# Patient Record
Sex: Female | Born: 1955 | Race: Black or African American | Hispanic: No | Smoking: Former smoker
Health system: Southern US, Community
[De-identification: ages and names within clinical notes are randomized; demographics above are authoritative.]

## PROBLEM LIST (undated history)

## (undated) DIAGNOSIS — I10 Essential (primary) hypertension: Secondary | ICD-10-CM

## (undated) DIAGNOSIS — E785 Hyperlipidemia, unspecified: Secondary | ICD-10-CM

## (undated) DIAGNOSIS — D219 Benign neoplasm of connective and other soft tissue, unspecified: Secondary | ICD-10-CM

## (undated) HISTORY — DX: Benign neoplasm of connective and other soft tissue, unspecified: D21.9

## (undated) HISTORY — PX: NO PAST SURGERIES: SHX2092

## (undated) HISTORY — PX: DILATION AND CURETTAGE OF UTERUS: SHX78

---

## 2011-09-19 ENCOUNTER — Emergency Department: Payer: Self-pay | Admitting: Emergency Medicine

## 2012-12-25 IMAGING — CR LEFT WRIST - COMPLETE 3+ VIEW
1 series · 4 of 4 positions shown · non-contrast
Comparison: none

REASON FOR EXAM: pain
COMMENTS:   LMP: N/A

PROCEDURE:     DXR - DXR WRIST LT COMP WITH OBLIQUES  - September 19, 2011  [DATE]
RESULT:     Left wrist images demonstrate bony density adjacent to the
fusiform which may represent an accessory ossicle or old avulsion. No
definite acute fractures seen. Correlate clinically.

[Series 1: x wrist pa left · 0.14mm/px · 4 of 4 slices shown]
[im 1/4]
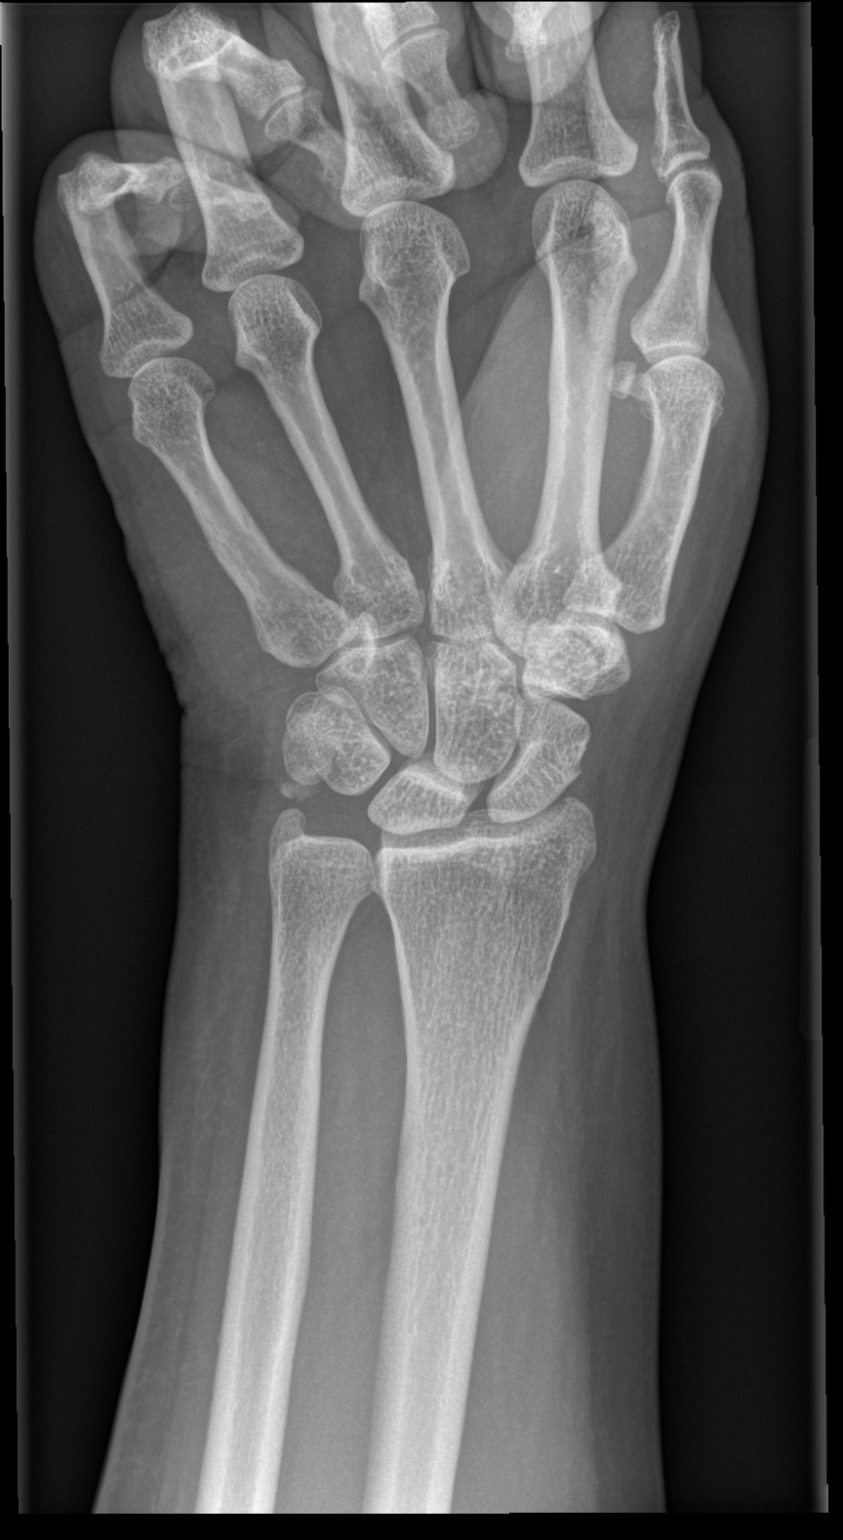
[im 2/4]
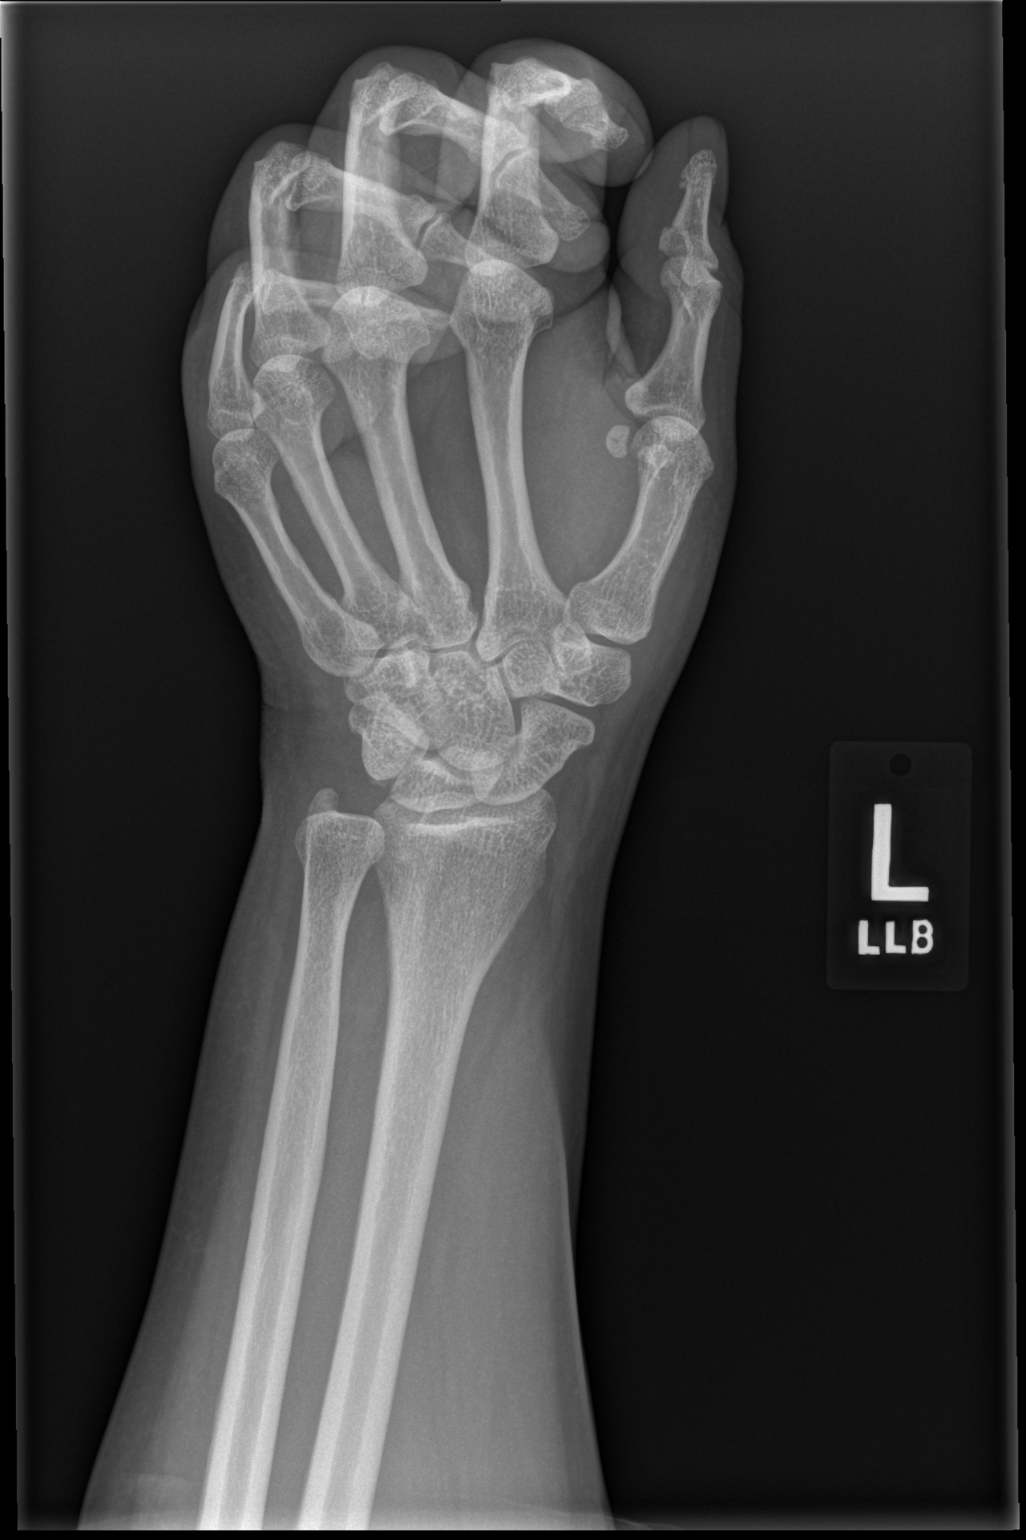
[im 3/4]
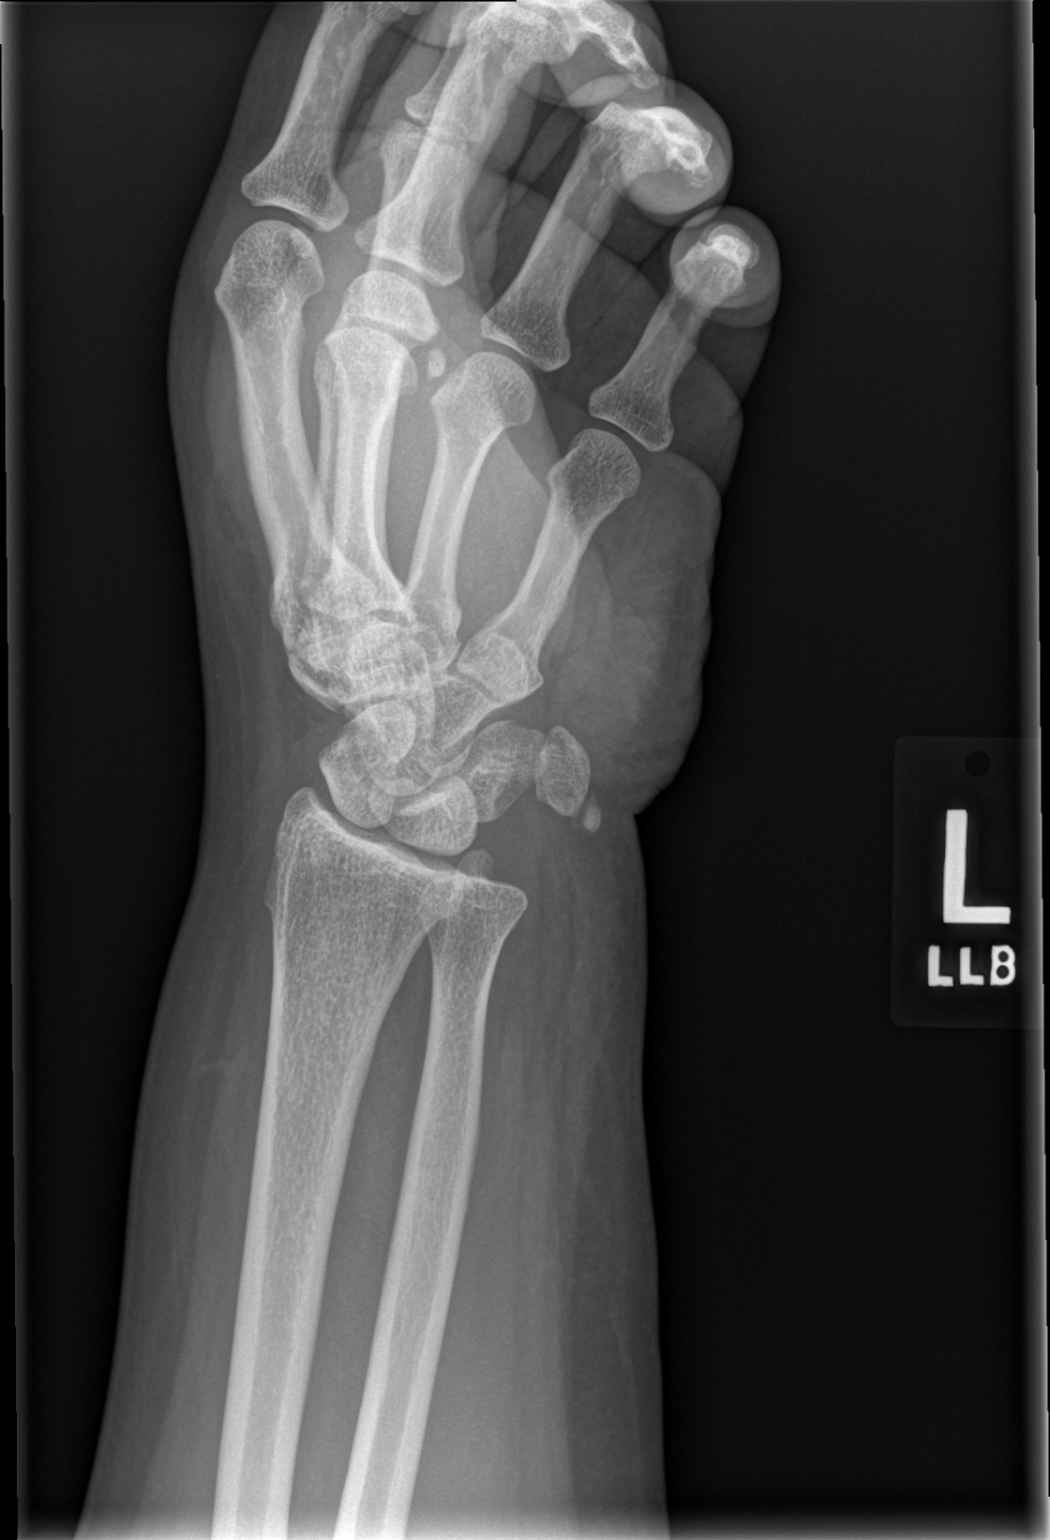
[im 4/4]
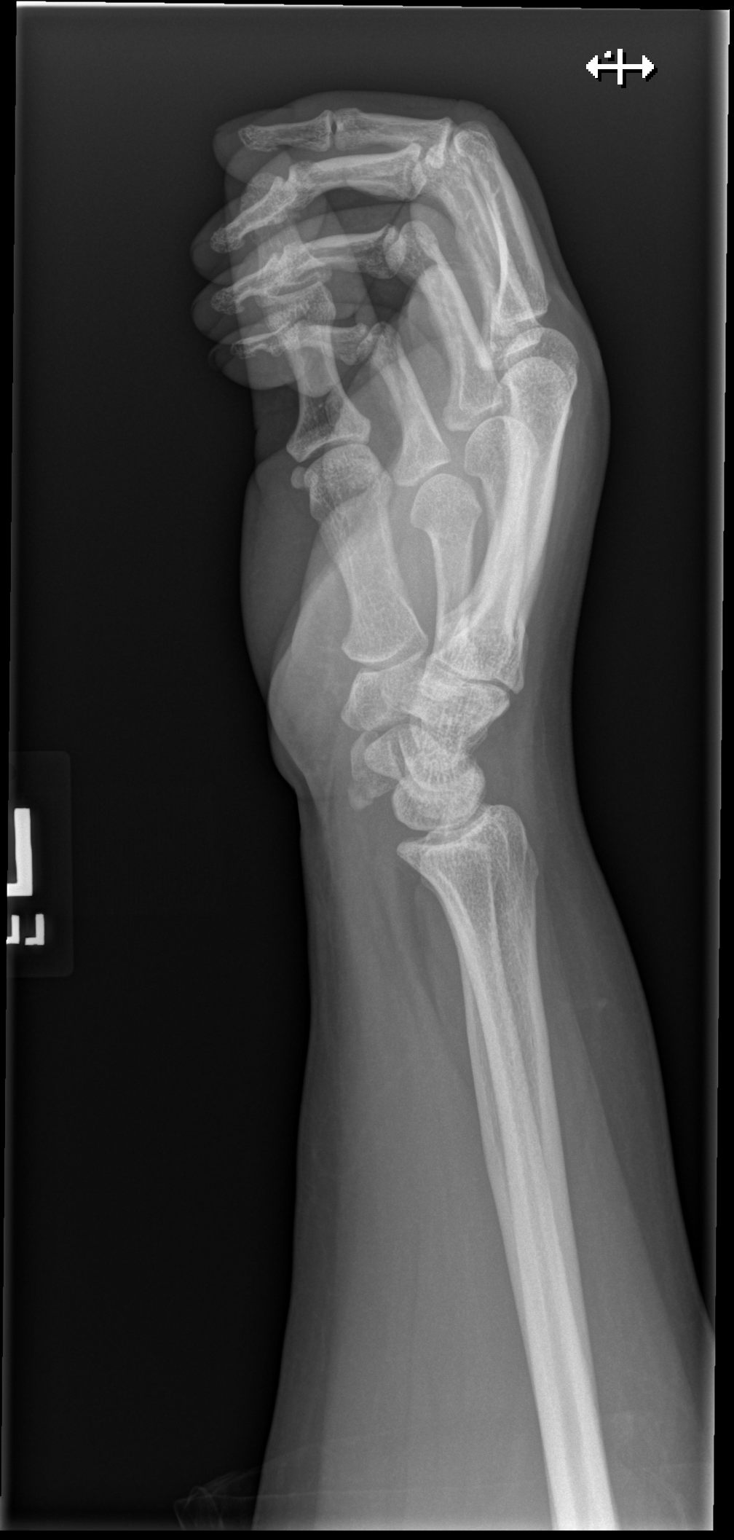

[4 of 4 positions shown; findings below may reference images not displayed]

IMPRESSION: Please see above. If occult fracture is a concern MRI
followup would be suggested.

## 2014-04-30 DIAGNOSIS — Z01419 Encounter for gynecological examination (general) (routine) without abnormal findings: Secondary | ICD-10-CM | POA: Insufficient documentation

## 2016-06-28 DIAGNOSIS — E875 Hyperkalemia: Secondary | ICD-10-CM | POA: Insufficient documentation

## 2016-07-12 DIAGNOSIS — R87619 Unspecified abnormal cytological findings in specimens from cervix uteri: Secondary | ICD-10-CM | POA: Insufficient documentation

## 2016-08-02 DIAGNOSIS — R059 Cough, unspecified: Secondary | ICD-10-CM | POA: Insufficient documentation

## 2018-07-16 DIAGNOSIS — H1030 Unspecified acute conjunctivitis, unspecified eye: Secondary | ICD-10-CM | POA: Insufficient documentation

## 2019-04-09 DIAGNOSIS — Z23 Encounter for immunization: Secondary | ICD-10-CM | POA: Insufficient documentation

## 2020-02-08 ENCOUNTER — Emergency Department: Payer: PRIVATE HEALTH INSURANCE

## 2020-02-08 ENCOUNTER — Other Ambulatory Visit: Payer: Self-pay

## 2020-02-08 ENCOUNTER — Encounter: Payer: Self-pay | Admitting: Intensive Care

## 2020-02-08 ENCOUNTER — Emergency Department
Admission: EM | Admit: 2020-02-08 | Discharge: 2020-02-08 | Disposition: A | Discharge status: Home / Self Care | Payer: PRIVATE HEALTH INSURANCE | Attending: Emergency Medicine | Admitting: Emergency Medicine

## 2020-02-08 DIAGNOSIS — N939 Abnormal uterine and vaginal bleeding, unspecified: Secondary | ICD-10-CM | POA: Diagnosis not present

## 2020-02-08 DIAGNOSIS — D259 Leiomyoma of uterus, unspecified: Secondary | ICD-10-CM | POA: Diagnosis not present

## 2020-02-08 DIAGNOSIS — F17210 Nicotine dependence, cigarettes, uncomplicated: Secondary | ICD-10-CM | POA: Diagnosis not present

## 2020-02-08 DIAGNOSIS — I10 Essential (primary) hypertension: Secondary | ICD-10-CM | POA: Diagnosis not present

## 2020-02-08 HISTORY — DX: Hyperlipidemia, unspecified: E78.5

## 2020-02-08 HISTORY — DX: Essential (primary) hypertension: I10

## 2020-02-08 NOTE — ED Triage Notes (Cosign Needed)
Emergency Medicine Provider Triage Evaluation Note  Tyshell Ramberg , a 64 y.o. female  was evaluated in triage.  Pt complains of vaginal bleeding.  Review of Systems  Positive: Vaginal bleeding, pelvic cramping, dysuria Negative: Fever  Physical Exam  There were no vitals taken for this visit. Gen:   Awake, no distress   HEENT:  Atraumatic  Resp:  Normal effort  Cardiac:  Normal rate  Abd:   Nondistended, lower pelvic tenderness MSK:   Moves extremities without difficulty  Neuro:  Speech clear   Medical Decision Making  Medically screening exam initiated at 5:20 PM.  Appropriate orders placed.  Dorthula Bier was informed that the remainder of the evaluation will be completed by another provider, this initial triage assessment does not replace that evaluation, and the importance of remaining in the ED until their evaluation is complete.  Clinical Impression   Abnormal vaginal bleeding in postmenopausal female.   Victorino Dike, FNP 02/08/20 1726

## 2020-02-08 NOTE — ED Triage Notes (Signed)
Patient c/o menstrual bleeding that started yesterday. Has had some clots. Pressure in lower abdomen. Reports more vaginal bleeding with movement. Reports d&c 2019

## 2020-02-08 NOTE — Discharge Instructions (Signed)
Please call and schedule a follow up appointment with gynecology.  Return to the ER for symptoms that change or worsen or for new concerns.

## 2020-02-25 NOTE — ED Provider Notes (Signed)
[facility] Emergency Department Provider Note  ____________________________________________  Time seen: Approximately 6:46 PM  I have reviewed the triage vital signs and the nursing notes.   HISTORY  Chief Complaint Vaginal Bleeding    HPI Felicia Barr is a 64 y.o. female who presents to the emergency department for evaluation of vaginal bleeding. She is post menopausal.  She has a history of D&C related to fibroids.  Bleeding started yesterday.  She has had some clots and pressure in the lower abdomen.  She reports her last pelvic exam performed by the gynecologist was 2019.  She denies any fever or other symptoms of concern.  Past Medical History:  Diagnosis Date   Hyperlipidemia    Hypertension     There are no problems to display for this patient.   History reviewed. No pertinent surgical history.  Prior to Admission medications   Not on File    Allergies Patient has no known allergies.  History reviewed. No pertinent family history.  Social History Social History   Tobacco Use   Smoking status: Current Every Day Smoker    Types: Cigarettes   Smokeless tobacco: Never Used  Substance Use Topics   Alcohol use: Never   Drug use: Never    Review of Systems Constitutional: Negative for fever. Respiratory: Negative for shortness of breath or cough. Gastrointestinal:  For abdominal pain; negative for nausea , negative for vomiting. Genitourinary: Occasional for dysuria , negative for vaginal discharge.  Negative for STD exposure.  Positive for vaginal bleeding. Musculoskeletal: Negative for back pain. Skin: Negative for acute skin changes/rash/lesion. ____________________________________________   PHYSICAL EXAM:  VITAL SIGNS: ED Triage Vitals  Enc Vitals Group     BP 02/08/20 1722 (!) 144/56     Pulse Rate 02/08/20 1722 70     Resp 02/08/20 1722 18     Temp 02/08/20 1722 99 F (37.2 C)     Temp Source 02/08/20 1722 Oral      SpO2 02/08/20 1722 95 %     Weight 02/08/20 1723 270 lb (122.5 kg)     Height 02/08/20 1723 5\' 5"  (1.651 m)     Head Circumference --      Peak Flow --      Pain Score 02/08/20 1722 1     Pain Loc --      Pain Edu? --      Excl. in [place]? --     Constitutional: Alert and oriented. Well appearing and in no acute distress. Eyes: Conjunctivae are normal. Head: Atraumatic. Nose: No congestion/rhinnorhea. Mouth/Throat: Mucous membranes are moist. Respiratory: Normal respiratory effort.  No retractions. Gastrointestinal: Bowel sounds active x 4; Abdomen is soft without rebound or guarding. Genitourinary: Pelvic exam: Not indicated Musculoskeletal: No extremity tenderness nor edema.  Neurologic:  Normal speech and language. No gross focal neurologic deficits are appreciated. Speech is normal. No gait instability. Skin:  Skin is warm, dry and intact. No rash noted on exposed skin. Psychiatric: Mood and affect are normal. Speech and behavior are normal.  ____________________________________________   LABS (all labs ordered are listed, but only abnormal results are displayed)  Labs Reviewed - No data to display ____________________________________________  RADIOLOGY  Ultrasound shows uterine fibroids. ____________________________________________  Procedures  ____________________________________________  64 year old female came to the emergency department for treatment and evaluation of vaginal bleeding that started yesterday.  See HPI for further details.  Patient's gynecologist is in a different state.  Ultrasound today shows fibroids.  This is the most  likely cause of her bleeding.  She will be given a referral to gynecology on-call.  She is to call and schedule a follow-up appointment.  Patient stated that she felt more relieved knowing that she has fibroids as she has had these in the past.  She agrees to the plan of care.  INITIAL IMPRESSION / ASSESSMENT AND PLAN / ED  COURSE  Pertinent labs & imaging results that were available during my care of the patient were reviewed by me and considered in my medical decision making (see chart for details).  ____________________________________________   FINAL CLINICAL IMPRESSION(S) / ED DIAGNOSES  Final diagnoses:  Abnormal vaginal bleeding  Uterine leiomyoma, unspecified location    Note:  This document was prepared using Dragon voice recognition software and may include unintentional dictation errors.   Victorino Dike, FNP 02/25/20 Marko Stai    Lavonia Drafts, MD 03/01/20 [phone]

## 2021-05-16 IMAGING — US US PELVIS COMPLETE
1 series · 14 of 25 positions shown · non-contrast
Comparison: None.

CLINICAL DATA: Abnormal vaginal bleeding.

EXAM:
TRANSABDOMINAL ULTRASOUND OF PELVIS
TECHNIQUE: Transabdominal ultrasound examination of the pelvis was performed
including evaluation of the uterus, ovaries, adnexal regions, and
pelvic cul-de-sac.

[Series 1: us pelvic complete with transvaginal · 61 acquisitions, 14 frames shown]
[im 1/61]
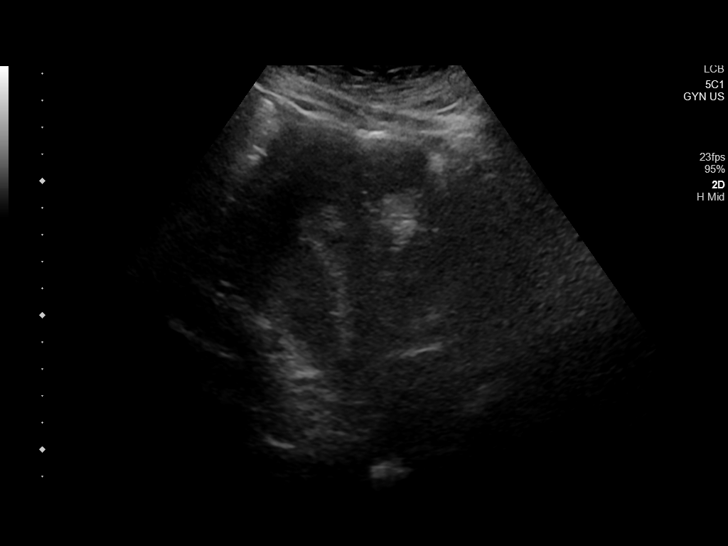
[im 6/61]
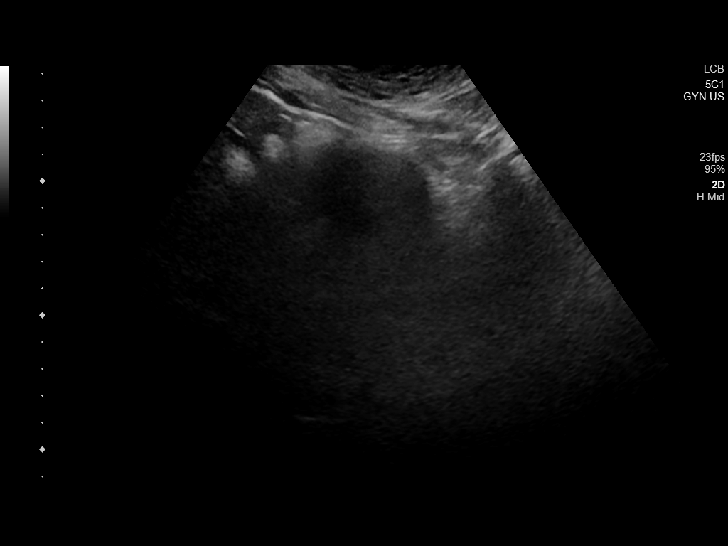
[im 11/61]
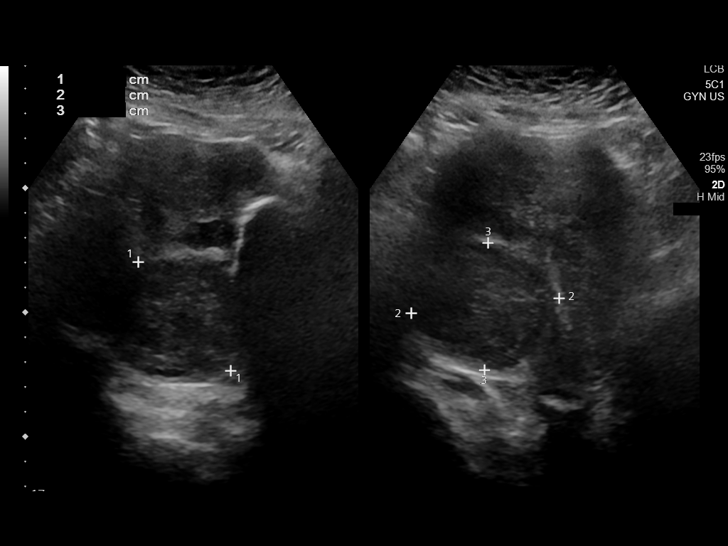
[im 16/61]
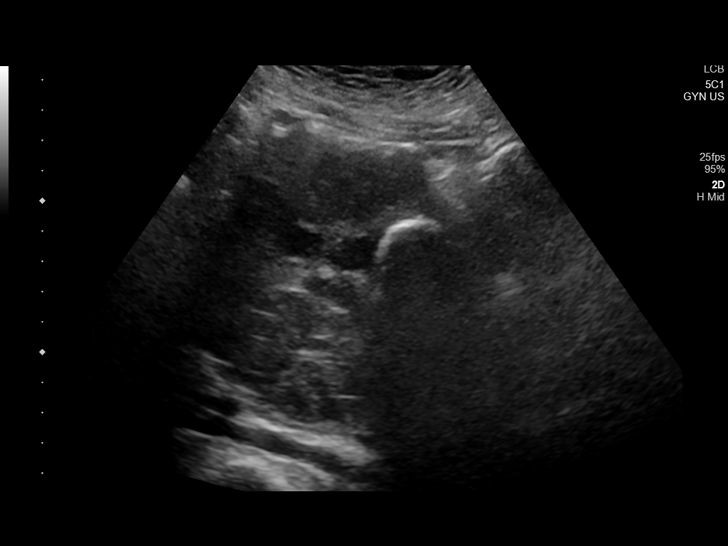
[im 21/61]
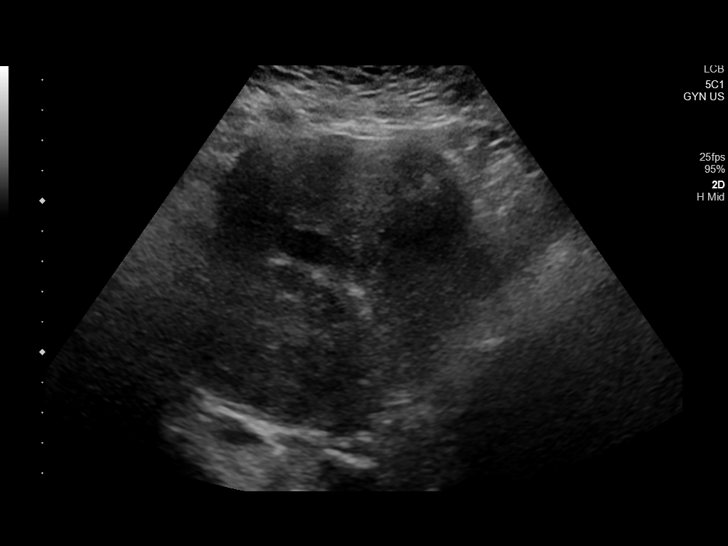
[im 23/61]
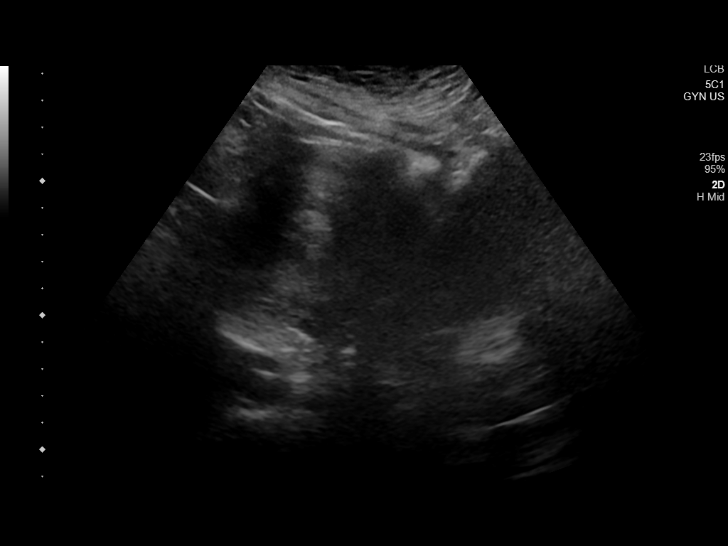
[im 28/61]
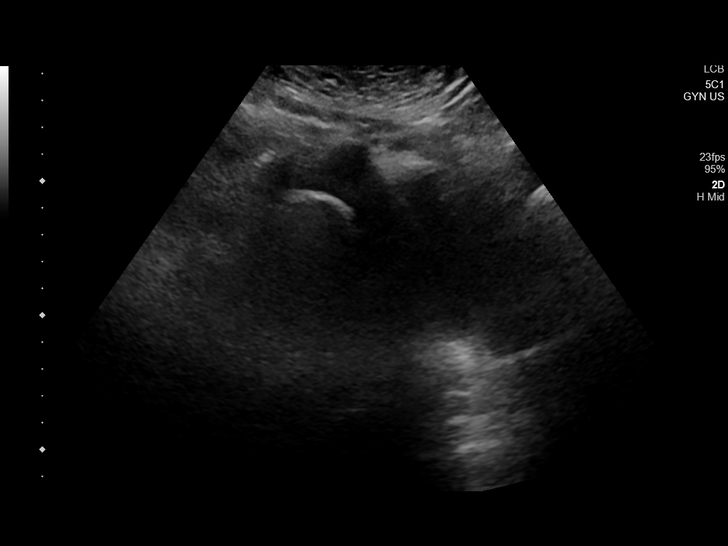
[im 33/61]
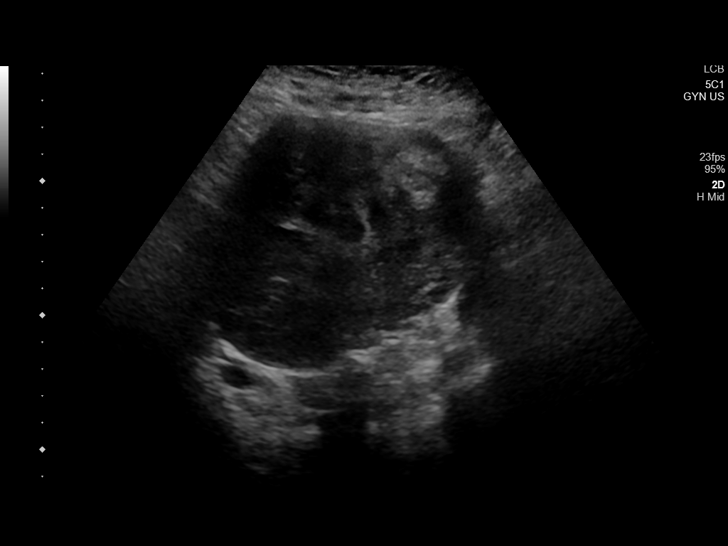
[im 38/61]
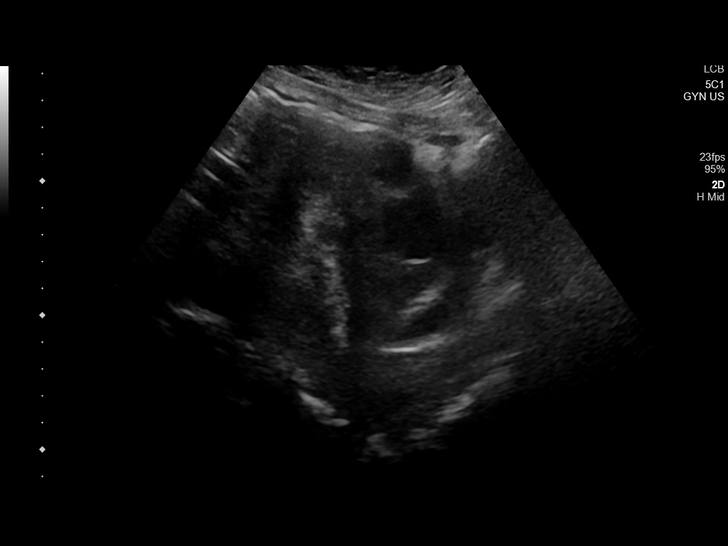
[im 41/61]
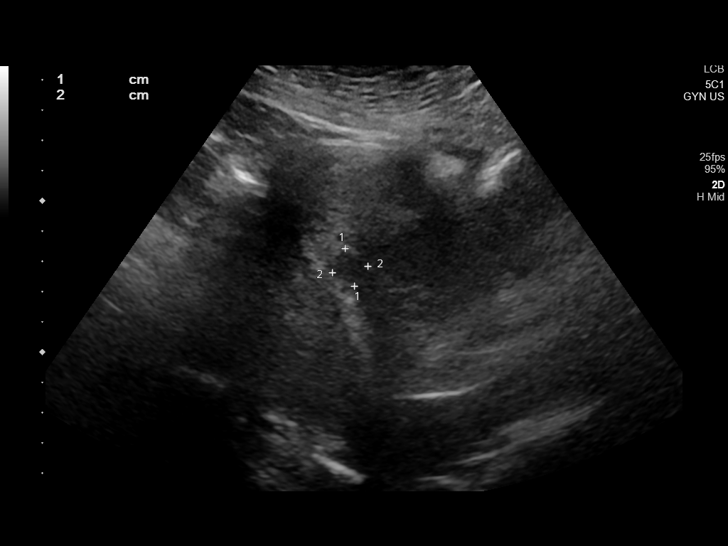
[im 46/61]
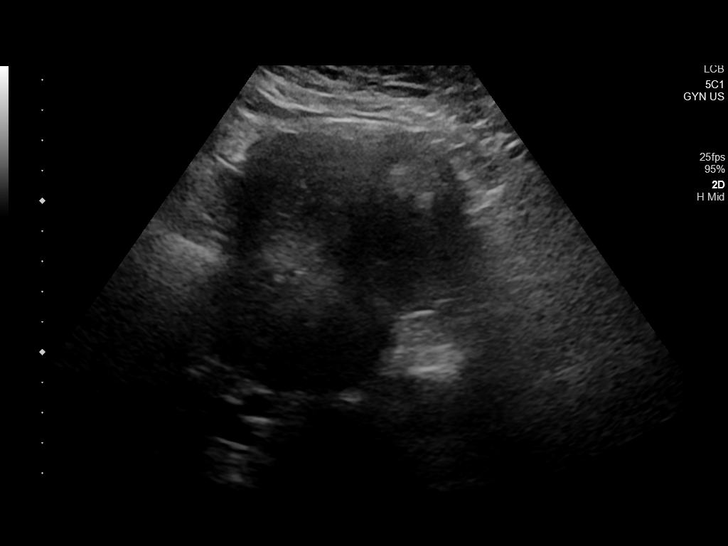
[im 51/61]
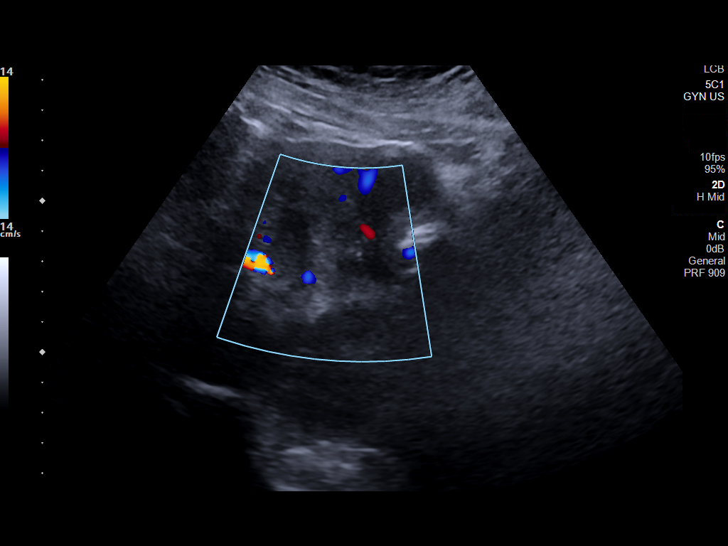
[im 56/61]
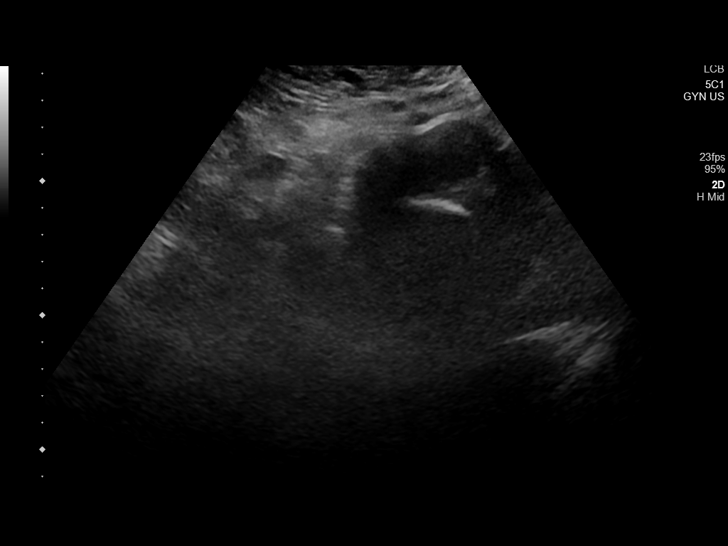
[im 61/61]
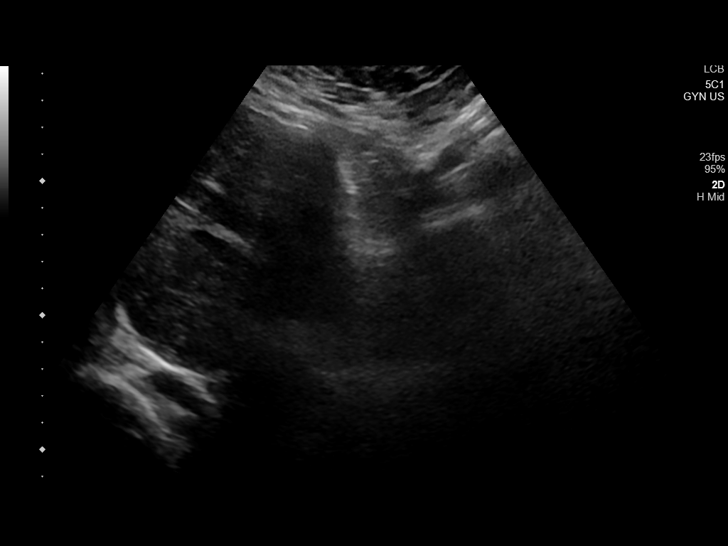

[14 of 25 positions shown; findings below may reference images not displayed]

FINDINGS: Uterus

Measurements: 11.7 cm x 9.3 cm x 9.9 cm = volume: 571 mL. Numerous
heterogeneous echogenic uterine fibroids are seen. The largest
measures approximately 5.7 cm x 6.0 cm x 5.1 cm and is seen within
the posterior aspect of the uterus on the right.

Endometrium

Thickness: 15.1 mm. A 1.3 cm x 1.2 cm x 1.2 cm hypoechoic area is
seen along the endometrium.

Right ovary

The right ovary is not visualized.

Left ovary

Left ovary is not visualized.

Other findings:  No abnormal free fluid.
IMPRESSION: 1. Multiple large heterogeneous uterine fibroids, as described
above.
2. Hypoechoic structure along the endometrium which may represent a
small endometrial fibroid.
3. Nonvisualization of the ovaries.

## 2021-10-05 DIAGNOSIS — R310 Gross hematuria: Secondary | ICD-10-CM | POA: Insufficient documentation

## 2022-03-11 ENCOUNTER — Inpatient Hospital Stay: Admission: RE | Admit: 2022-03-11 | Payer: PRIVATE HEALTH INSURANCE | Source: Ambulatory Visit

## 2022-06-29 ENCOUNTER — Encounter: Payer: Self-pay | Admitting: Family Medicine

## 2022-06-29 ENCOUNTER — Encounter: Payer: Commercial Managed Care - PPO | Admitting: Family Medicine

## 2022-06-29 NOTE — Progress Notes (Signed)
Patient did not keep appointment today. She may call to reschedule.  

## 2022-07-25 ENCOUNTER — Ambulatory Visit (INDEPENDENT_AMBULATORY_CARE_PROVIDER_SITE_OTHER): Payer: Medicare HMO | Admitting: Family

## 2022-07-25 ENCOUNTER — Telehealth: Payer: Self-pay | Admitting: Family

## 2022-07-25 ENCOUNTER — Encounter: Payer: Self-pay | Admitting: Family

## 2022-07-25 VITALS — BP 132/76 | HR 82 | Temp 98.8°F | Ht 65.0 in | Wt 276.0 lb

## 2022-07-25 DIAGNOSIS — R7303 Prediabetes: Secondary | ICD-10-CM

## 2022-07-25 DIAGNOSIS — R7989 Other specified abnormal findings of blood chemistry: Secondary | ICD-10-CM

## 2022-07-25 DIAGNOSIS — Z1231 Encounter for screening mammogram for malignant neoplasm of breast: Secondary | ICD-10-CM

## 2022-07-25 DIAGNOSIS — I499 Cardiac arrhythmia, unspecified: Secondary | ICD-10-CM | POA: Diagnosis not present

## 2022-07-25 DIAGNOSIS — R9431 Abnormal electrocardiogram [ECG] [EKG]: Secondary | ICD-10-CM

## 2022-07-25 DIAGNOSIS — E559 Vitamin D deficiency, unspecified: Secondary | ICD-10-CM | POA: Diagnosis not present

## 2022-07-25 DIAGNOSIS — I10 Essential (primary) hypertension: Secondary | ICD-10-CM

## 2022-07-25 DIAGNOSIS — R748 Abnormal levels of other serum enzymes: Secondary | ICD-10-CM

## 2022-07-25 DIAGNOSIS — E782 Mixed hyperlipidemia: Secondary | ICD-10-CM | POA: Insufficient documentation

## 2022-07-25 DIAGNOSIS — Z79899 Other long term (current) drug therapy: Secondary | ICD-10-CM

## 2022-07-25 DIAGNOSIS — R002 Palpitations: Secondary | ICD-10-CM | POA: Diagnosis not present

## 2022-07-25 DIAGNOSIS — Z862 Personal history of diseases of the blood and blood-forming organs and certain disorders involving the immune mechanism: Secondary | ICD-10-CM | POA: Diagnosis not present

## 2022-07-25 DIAGNOSIS — Z780 Asymptomatic menopausal state: Secondary | ICD-10-CM

## 2022-07-25 DIAGNOSIS — D259 Leiomyoma of uterus, unspecified: Secondary | ICD-10-CM

## 2022-07-25 DIAGNOSIS — Z86018 Personal history of other benign neoplasm: Secondary | ICD-10-CM

## 2022-07-25 LAB — COMPREHENSIVE METABOLIC PANEL
ALT: 18 U/L (ref 0–35)
AST: 23 U/L (ref 0–37)
Albumin: 4.4 g/dL (ref 3.5–5.2)
Alkaline Phosphatase: 63 U/L (ref 39–117)
BUN: 8 mg/dL (ref 6–23)
CO2: 26 mEq/L (ref 19–32)
Calcium: 9.1 mg/dL (ref 8.4–10.5)
Chloride: 105 mEq/L (ref 96–112)
Creatinine, Ser: 0.86 mg/dL (ref 0.40–1.20)
GFR: 71.06 mL/min (ref >60.00)
Glucose, Bld: 102 mg/dL — ABNORMAL HIGH (ref 70–99)
Potassium: 4.1 mEq/L (ref 3.5–5.1)
Sodium: 138 mEq/L (ref 135–145)
Total Bilirubin: 0.4 mg/dL (ref 0.2–1.2)
Total Protein: 7.1 g/dL (ref 6.0–8.3)

## 2022-07-25 LAB — IBC + FERRITIN
Ferritin: 11.4 ng/mL (ref 10.0–291.0)
Iron: 82 ug/dL (ref 42–145)
Saturation Ratios: 22.6 % (ref 20.0–50.0)
TIBC: 362.6 ug/dL (ref 250.0–450.0)
Transferrin: 259 mg/dL (ref 212.0–360.0)

## 2022-07-25 LAB — LIPID PANEL
Cholesterol: 99 mg/dL (ref 0–200)
HDL: 40.4 mg/dL (ref >39.00)
LDL Cholesterol: 45 mg/dL (ref 0–99)
NonHDL: 58.73
Total CHOL/HDL Ratio: 2
Triglycerides: 67 mg/dL (ref 0.0–149.0)
VLDL: 13.4 mg/dL (ref 0.0–40.0)

## 2022-07-25 LAB — CBC
HCT: 44.3 % (ref 36.0–46.0)
Hemoglobin: 15.2 g/dL — ABNORMAL HIGH (ref 12.0–15.0)
MCHC: 34.3 g/dL (ref 30.0–36.0)
MCV: 98.5 fl (ref 78.0–100.0)
Platelets: 154 10*3/uL (ref 150.0–400.0)
RBC: 4.49 Mil/uL (ref 3.87–5.11)
RDW: 14.4 % (ref 11.5–15.5)
WBC: 4.2 10*3/uL (ref 4.0–10.5)

## 2022-07-25 LAB — MICROALBUMIN / CREATININE URINE RATIO
Creatinine,U: 218.8 mg/dL
Microalb Creat Ratio: 3.9 mg/g (ref 0.0–30.0)
Microalb, Ur: 8.5 mg/dL — ABNORMAL HIGH (ref 0.0–1.9)

## 2022-07-25 LAB — VITAMIN D 25 HYDROXY (VIT D DEFICIENCY, FRACTURES): VITD: 20.1 ng/mL — ABNORMAL LOW (ref 30.00–100.00)

## 2022-07-25 LAB — TSH: TSH: 2.14 u[IU]/mL (ref 0.35–5.50)

## 2022-07-25 LAB — VITAMIN B12: Vitamin B-12: 881 pg/mL (ref 211–911)

## 2022-07-25 LAB — HEMOGLOBIN A1C: Hgb A1c MFr Bld: 6.2 % (ref 4.6–6.5)

## 2022-07-25 NOTE — Assessment & Plan Note (Signed)
Ordered lipid panel, pending results. Work on low cholesterol diet and exercise as tolerated Continue crestor

## 2022-07-25 NOTE — Progress Notes (Signed)
New Patient Office Visit  Subjective:  Patient ID: Felicia Barr, female    DOB: 1956-03-17  Age: 67 y.o. MRN: [id number]  CC:  Chief Complaint  Patient presents with   Establish Care    HPI Felicia Barr is here to establish care as a new patient.  Prior provider was: in [place] , moved here officially 2020.  OBGYN Dr. Sondra Come , here establishing with Dr. Kennon Rounds  Cardiology Dr. Hartford Poli , has not seen since 2020 Pt is without acute concerns.   Colonoscopy 09/15/20  Caffeine intake, average one a day sometimes two.   chronic concerns:  HTN: average <140/80 at home doing well. Denies cp palp and or sob. On amlodipine valsartan 10-320 mg once daily.   HLD: on crestor 20 mg once daily, denies myalgias. Work on low cholesterol best she can. Is a vegetarian. Does try to exercise, walking throughout the week as well as learning how to swim.    Uterine fibroids, aub, not since this last year. Was seeing GYN in [place] , has new GYN here .   Cigarette smoker, on and off over the last ten years. Total has been about thirty years smoker.   Past Medical History:  Diagnosis Date   Hyperlipidemia    Hypertension     Past Surgical History:  Procedure Laterality Date   NO PAST SURGERIES      Family History  Problem Relation Age of Onset   Aneurysm Mother    Heart disease Father    Heart attack Father        in his 52's, second MI age 70    Social History   Socioeconomic History   Marital status: Significant Other    Spouse name: Not on file   Number of children: 2   Years of education: Not on file   Highest education level: Not on file  Occupational History   Occupation: retired  Tobacco Use   Smoking status: Every Day    Packs/day: 0.25    Years: 10.00    Total pack years: 2.50    Types: Cigarettes   Smokeless tobacco: Never  Vaping Use   Vaping Use: Never used  Substance and Sexual Activity   Alcohol use: Never   Drug use: Never   Sexual activity: Yes    Partners: Male     Birth control/protection: Post-menopausal  Other Topics Concern   Not on file  Social History Narrative   Not on file   Social Determinants of Health   Financial Resource Strain: Not on file  Food Insecurity: Not on file  Transportation Needs: Not on file  Physical Activity: Not on file  Stress: Not on file  Social Connections: Not on file  Intimate Partner Violence: Not on file    Outpatient Medications Prior to Visit  Medication Sig Dispense Refill   amLODipine-valsartan (EXFORGE) 10-320 MG tablet Take 1 tablet by mouth daily.     rosuvastatin (CRESTOR) 20 MG tablet Take 20 mg by mouth daily.     No facility-administered medications prior to visit.    No Known Allergies  ROS: Pertinent symptoms negative unless otherwise noted in HPI      Objective:    Physical Exam Vitals reviewed.  Constitutional:      Appearance: Normal appearance. She is obese.  Eyes:     General:        Right eye: No discharge.        Left eye: No discharge.  Conjunctiva/sclera: Conjunctivae normal.  Cardiovascular:     Rate and Rhythm: Normal rate. Rhythm regularly irregular.  Pulmonary:     Effort: Pulmonary effort is normal. No respiratory distress.  Musculoskeletal:        General: Normal range of motion.     Cervical back: Normal range of motion.  Neurological:     General: No focal deficit present.     Mental Status: She is alert and oriented to person, place, and time. Mental status is at baseline.  Psychiatric:        Mood and Affect: Mood normal.        Behavior: Behavior normal.        Thought Content: Thought content normal.        Judgment: Judgment normal.      BP 132/76   Pulse 82   Temp 98.8 F (37.1 C) (Temporal)   Ht '5\' 5"'$  (1.651 m)   Wt 276 lb (125.2 kg)   SpO2 99%   BMI 45.93 kg/m  Wt Readings from Last 3 Encounters:  07/25/22 276 lb (125.2 kg)  02/08/20 270 lb (122.5 kg)     There are no preventive care reminders to display for this  patient.  There are no preventive care reminders to display for this patient.  No results found for: "TSH" No results found for: "WBC", "HGB", "HCT", "MCV", "PLT" No results found for: "NA", "K", "CHLORIDE", "CO2", "GLUCOSE", "BUN", "CREATININE", "BILITOT", "ALKPHOS", "AST", "ALT", "PROT", "ALBUMIN", "CALCIUM", "ANIONGAP", "EGFR", "GFR" No results found for: "CHOL" No results found for: "HDL" No results found for: "LDLCALC" No results found for: "TRIG" No results found for: "CHOLHDL" No results found for: "HGBA1C"    Assessment & Plan:   Problem List Items Addressed This Visit       Cardiovascular and Mediastinum   Primary hypertension - Primary    Continue medication as prescribed.        Relevant Medications   rosuvastatin (CRESTOR) 20 MG tablet   amLODipine-valsartan (EXFORGE) 10-320 MG tablet   Other Relevant Orders   Microalbumin / creatinine urine ratio     Genitourinary   Uterine leiomyoma     Other   History of iron deficiency anemia    Ibc ferritin and cbc ordered pending results      Relevant Orders   CBC   IBC + Ferritin   Vitamin D deficiency   Relevant Orders   VITAMIN D 25 Hydroxy (Vit-D Deficiency, Fractures)   Mixed hyperlipidemia    Ordered lipid panel, pending results. Work on low cholesterol diet and exercise as tolerated Continue crestor       Relevant Medications   rosuvastatin (CRESTOR) 20 MG tablet   amLODipine-valsartan (EXFORGE) 10-320 MG tablet   Other Relevant Orders   Lipid panel   Prediabetes    Pt advised of the following: Work on a diabetic diet, try to incorporate exercise at least 20-30 a day for 3 days a week or more.        Relevant Orders   Hemoglobin A1c   Irregular heart rhythm    Heard on exam, EKG with frequent PVCs trigeminy and LVH  Advised pt to decrease caffeine intake and decrease/quit nicotine use as can exacerbate.  Referral placed to cardiology for frequency of PVC's possible need for holter montior and  or BB      Relevant Orders   EKG 12-Lead (Completed)   TSH   CBC   IBC + Ferritin   Ambulatory referral to  Cardiology   Palpitations   Relevant Orders   EKG 12-Lead (Completed)   TSH   CBC   IBC + Ferritin   Ambulatory referral to Cardiology   Abnormal EKG   Relevant Orders   Ambulatory referral to Cardiology   RESOLVED: History of uterine fibroid   Other Visit Diagnoses     Increased vitamin B12 level       Relevant Orders   Vitamin B12   On statin therapy       Relevant Orders   Comprehensive metabolic panel   Postmenopausal       Relevant Orders   DG Bone Density   Screening mammogram for breast cancer       Relevant Orders   MM 3D SCREEN BREAST BILATERAL       No orders of the defined types were placed in this encounter.   Follow-up: Return in about 3 months (around 10/23/2022) for f/u CPE, f/u cholesterol.    Eugenia Pancoast, FNP

## 2022-07-25 NOTE — Telephone Encounter (Signed)
Pt is checking out and would like to see if she can get 90 days refill of amlodipine-valsartan (EXFORGE) 10-320 MG and rosuvastatin (CRESTOR) 20 MG.  Pharm: [facility] in [place], [place].  Pt would like to have a call when it is sent in.

## 2022-07-25 NOTE — Assessment & Plan Note (Signed)
Pt advised of the following: Work on a diabetic diet, try to incorporate exercise at least 20-30 a day for 3 days a week or more.   

## 2022-07-25 NOTE — Assessment & Plan Note (Signed)
Continue medication as prescribed.

## 2022-07-25 NOTE — Addendum Note (Signed)
Addended by: Vaughan Browner on: 07/25/2022 01:00 PM   Modules accepted: Orders

## 2022-07-25 NOTE — Patient Instructions (Addendum)
------------------------------------   I have sent an electronic order over to your preferred location for the following:   '[]'$   2D Mammogram  '[x]'$   3D Mammogram  '[x]'$   Bone Density   Please give this center a call to get scheduled at your convenience.  '[x]'$   [facility]  [place] [address]  [phone]  Make sure to wear two piece  clothing  No lotions powders or deodorants the day of the appointment Make sure to bring picture ID and insurance card.  Bring list of medications you are currently taking including any supplements.   ------------------------------------  A referral was placed today for cardiology.  Please let us know if you have not heard back within 2 weeks about the referral.  Try to limit caffeine and decrease nicotine which could be contributing to frequent PVCs. Welcome to our clinic, I am happy to have you as my new patient. I am excited to continue on this healthcare journey with you.  ------------------------------------  Stop by the lab prior to leaving today. I will notify you of your results once received.   Please keep in mind Any my chart messages you send have up to a three business day turnaround for a response.  Phone calls may take up to a one full business day turnaround for a  response.   If you need a medication refill I recommend you request it through the pharmacy as this is easiest for [place] rather than sending a message and or phone call.   Due to recent changes in healthcare laws, you may see results of your imaging and/or laboratory studies on MyChart before I have had a chance to review them.  I understand that in some cases there may be results that are confusing or concerning to you. Please understand that not all results are received at the same time and often I may need to interpret multiple results in order to provide you with the best plan of care or course of  treatment. Therefore, I ask that you please give me 2 business days to thoroughly review all your results before contacting my office for clarification. Should we see a critical lab result, you will be contacted sooner.   It was a pleasure seeing you today! Please do not hesitate to reach out with any questions and or concerns.  Regards,   Eugenia Pancoast FNP-C

## 2022-07-25 NOTE — Assessment & Plan Note (Signed)
Ibc ferritin and cbc ordered pending results

## 2022-07-25 NOTE — Assessment & Plan Note (Signed)
Heard on exam, EKG with frequent PVCs trigeminy and LVH  Advised pt to decrease caffeine intake and decrease/quit nicotine use as can exacerbate.  Referral placed to cardiology for frequency of PVC's possible need for holter montior and or BB

## 2022-07-26 ENCOUNTER — Other Ambulatory Visit: Payer: Self-pay | Admitting: Family

## 2022-07-28 ENCOUNTER — Encounter: Payer: Self-pay | Admitting: Family

## 2022-07-28 ENCOUNTER — Other Ambulatory Visit: Payer: Self-pay

## 2022-07-28 ENCOUNTER — Other Ambulatory Visit: Payer: Self-pay | Admitting: Family

## 2022-07-28 MED ORDER — VITAMIN D (ERGOCALCIFEROL) 1.25 MG (50000 UNIT) PO CAPS: 50000.0000 [IU] | ORAL_CAPSULE | ORAL | 0 refills | Status: DC

## 2022-07-29 NOTE — Telephone Encounter (Signed)
You have not refilled either ok to send in refills?

## 2022-08-01 ENCOUNTER — Other Ambulatory Visit: Payer: Self-pay

## 2022-08-01 ENCOUNTER — Encounter: Payer: Self-pay | Admitting: Family

## 2022-08-01 MED ORDER — ROSUVASTATIN CALCIUM 20 MG PO TABS
20.0000 mg | ORAL_TABLET | Freq: Every day | ORAL | 3 refills | Status: DC
  Filled 2022-08-01 (×2): qty 90, 90d supply, fill #0

## 2022-08-01 MED ORDER — AMLODIPINE BESYLATE-VALSARTAN 10-320 MG PO TABS
1.0000 | ORAL_TABLET | Freq: Every day | ORAL | 3 refills | Status: DC
  Filled 2022-08-01: qty 90, 90d supply, fill #0

## 2022-08-02 ENCOUNTER — Other Ambulatory Visit: Payer: Self-pay

## 2022-08-02 MED ORDER — AMLODIPINE BESYLATE-VALSARTAN 10-320 MG PO TABS: 1.0000 | ORAL_TABLET | Freq: Every day | ORAL | 3 refills | Status: DC

## 2022-08-02 MED ORDER — ROSUVASTATIN CALCIUM 20 MG PO TABS: 20.0000 mg | ORAL_TABLET | Freq: Every day | ORAL | 3 refills | Status: DC

## 2022-08-02 NOTE — Telephone Encounter (Signed)
Ok to call in new script as pended

## 2022-08-15 ENCOUNTER — Ambulatory Visit: Payer: Medicare HMO | Attending: Interventional Cardiology | Admitting: Interventional Cardiology

## 2022-08-15 ENCOUNTER — Encounter: Payer: Self-pay | Admitting: Interventional Cardiology

## 2022-08-15 VITALS — BP 122/76 | HR 83 | Ht 66.0 in | Wt 271.6 lb

## 2022-08-15 DIAGNOSIS — R7303 Prediabetes: Secondary | ICD-10-CM

## 2022-08-15 DIAGNOSIS — I493 Ventricular premature depolarization: Secondary | ICD-10-CM | POA: Diagnosis not present

## 2022-08-15 DIAGNOSIS — E782 Mixed hyperlipidemia: Secondary | ICD-10-CM

## 2022-08-15 DIAGNOSIS — R002 Palpitations: Secondary | ICD-10-CM | POA: Diagnosis not present

## 2022-08-15 DIAGNOSIS — Z720 Tobacco use: Secondary | ICD-10-CM

## 2022-08-15 NOTE — Patient Instructions (Signed)
Medication Instructions:  Your physician recommends that you continue on your current medications as directed. Please refer to the Current Medication list given to you today.  *If you need a refill on your cardiac medications before your next appointment, please call your pharmacy*   Lab Work: none If you have labs (blood work) drawn today and your tests are completely normal, you will receive your results only by: [place] (if you have MyChart) OR A paper copy in the mail If you have any lab test that is abnormal or we need to change your treatment, we will call you to review the results.   Testing/Procedures: Dr Irish Lack recommends you have a Calcium Score CT Scan   Follow-Up: At [facility], you and your health needs are our priority.  As part of our continuing mission to provide you with exceptional heart care, we have created designated Provider Care Teams.  These Care Teams include your primary Cardiologist (physician) and Advanced Practice Providers (APPs -  Physician Assistants and Nurse Practitioners) who all work together to provide you with the care you need, when you need it.  We recommend signing up for the patient portal called "MyChart".  Sign up information is provided on this After Visit Summary.  MyChart is used to connect with patients for Virtual Visits (Telemedicine).  Patients are able to view lab/test results, encounter notes, upcoming appointments, etc.  Non-urgent messages can be sent to your provider as well.   To learn more about what you can do with MyChart, go to [email or web address].    Your next appointment:   12 month(s)  Provider:   Larae Grooms, MD     Other Instructions    1-800-QUIT-NOW--Number to help with quitting smoking.  High-Fiber Eating Plan Fiber, also called dietary fiber, is a type of carbohydrate. It is found foods such as fruits, vegetables, whole grains, and beans. A high-fiber diet can have many health  benefits. Your health care provider may recommend a high-fiber diet to help: Prevent constipation. Fiber can make your bowel movements more regular. Lower your cholesterol. Relieve the following conditions: Inflammation of veins in the anus (hemorrhoids). Inflammation of specific areas of the digestive tract (uncomplicated diverticulosis). A problem of the large intestine, also called the colon, that sometimes causes pain and diarrhea (irritable bowel syndrome, or IBS). Prevent overeating as part of a weight-loss plan. Prevent heart disease, type 2 diabetes, and certain cancers. What are tips for following this plan? Reading food labels  Check the nutrition facts label on food products for the amount of dietary fiber. Choose foods that have 5 grams of fiber or more per serving. The goals for recommended daily fiber intake include: Men (age 71 or younger): 34-38 g. Men (over age 28): 28-34 g. Women (age 35 or younger): 25-28 g. Women (over age 40): 22-25 g. Your daily fiber goal is _____________ g. Shopping Choose whole fruits and vegetables instead of processed forms, such as apple juice or applesauce. Choose a wide variety of high-fiber foods such as avocados, lentils, oats, and kidney beans. Read the nutrition facts label of the foods you choose. Be aware of foods with added fiber. These foods often have high sugar and sodium amounts per serving. Cooking Use whole-grain flour for baking and cooking. Cook with brown rice instead of white rice. Meal planning Start the day with a breakfast that is high in fiber, such as a cereal that contains 5 g of fiber or more per serving. Eat breads and cereals  that are made with whole-grain flour instead of refined flour or white flour. Eat brown rice, bulgur wheat, or millet instead of white rice. Use beans in place of meat in soups, salads, and pasta dishes. Be sure that half of the grains you eat each day are whole grains. General  information You can get the recommended daily intake of dietary fiber by: Eating a variety of fruits, vegetables, grains, nuts, and beans. Taking a fiber supplement if you are not able to take in enough fiber in your diet. It is better to get fiber through food than from a supplement. Gradually increase how much fiber you consume. If you increase your intake of dietary fiber too quickly, you may have bloating, cramping, or gas. Drink plenty of water to help you digest fiber. Choose high-fiber snacks, such as berries, raw vegetables, nuts, and popcorn. What foods should I eat? Fruits Berries. Pears. Apples. Oranges. Avocado. Prunes and raisins. Dried figs. Vegetables Sweet potatoes. Spinach. Kale. Artichokes. Cabbage. Broccoli. Cauliflower. Green peas. Carrots. Squash. Grains Whole-grain breads. Multigrain cereal. Oats and oatmeal. Brown rice. Barley. Bulgur wheat. [place]. Quinoa. Bran muffins. Popcorn. Rye wafer crackers. Meats and other proteins Navy beans, kidney beans, and pinto beans. Soybeans. Split peas. Lentils. Nuts and seeds. Dairy Fiber-fortified yogurt. Beverages Fiber-fortified soy milk. Fiber-fortified orange juice. Other foods Fiber bars. The items listed above may not be a complete list of recommended foods and beverages. Contact a dietitian for more information. What foods should I avoid? Fruits Fruit juice. Cooked, strained fruit. Vegetables Fried potatoes. Canned vegetables. Well-cooked vegetables. Grains White bread. Pasta made with refined flour. White rice. Meats and other proteins Fatty cuts of meat. Fried chicken or fried fish. Dairy Milk. Yogurt. Cream cheese. Sour cream. Fats and oils Butters. Beverages Soft drinks. Other foods Cakes and pastries. The items listed above may not be a complete list of foods and beverages to avoid. Talk with your dietitian about what choices are best for you. Summary Fiber is a type of carbohydrate. It is found in foods  such as fruits, vegetables, whole grains, and beans. A high-fiber diet has many benefits. It can help to prevent constipation, lower blood cholesterol, aid weight loss, and reduce your risk of heart disease, diabetes, and certain cancers. Increase your intake of fiber gradually. Increasing fiber too quickly may cause cramping, bloating, and gas. Drink plenty of water while you increase the amount of fiber you consume. The best sources of fiber include whole fruits and vegetables, whole grains, nuts, seeds, and beans. This information is not intended to replace advice given to you by your health care provider. Make sure you discuss any questions you have with your health care provider. Document Revised: 10/10/2019 Document Reviewed: 10/10/2019 Elsevier Patient Education  [place].

## 2022-08-15 NOTE — Progress Notes (Signed)
Cardiology Office Note   Date:  08/15/2022   ID:  Felicia Barr, DOB [birth year], MRN [id number]  PCP:  Eugenia Pancoast, FNP    No chief complaint on file.  Palpitations  Wt Readings from Last 3 Encounters:  08/15/22 271 lb 9.6 oz (123.2 kg)  07/25/22 276 lb (125.2 kg)  02/08/20 270 lb (122.5 kg)       History of Present Illness: Felicia Barr is a 67 y.o. female who is being seen today for the evaluation of palpitations at the request of Eugenia Pancoast, FNP.   She has had palpitations for years.  She moved here from [place].  Stressful job.  SHe saw a cardiologist there.  She wore a monitor, echo and stress test that were normal in 2019.  Moved to [place] in 2020.  Has family here.   Worse palpitations if she is stressed.  Lasting seconds.  No associated shortness of breath.  Rare episodes.  Not daily.  Cannot quantify.  Infrequent. Sx toward the middle of the chest..  Walk up stairs without any difficulties.  Taking swimming lessons and has learned to swim. She has some fatigue that she feels is related to weight.  Not limiting daily activities.  Still smoking.  Smoked for years but has cut back.   Past Medical History:  Diagnosis Date   Hyperlipidemia    Hypertension     Past Surgical History:  Procedure Laterality Date   NO PAST SURGERIES       Current Outpatient Medications  Medication Sig Dispense Refill   amLODipine-valsartan (EXFORGE) 10-320 MG tablet Take 1 tablet by mouth daily. 90 tablet 3   rosuvastatin (CRESTOR) 20 MG tablet Take 1 tablet (20 mg total) by mouth daily. 90 tablet 3   Vitamin D, Ergocalciferol, (DRISDOL) 1.25 MG (50000 UNIT) CAPS capsule Take 1 capsule (50,000 Units total) by mouth every 7 (seven) days. 8 capsule 0   No current facility-administered medications for this visit.    Allergies:   Patient has no known allergies.    Social History:  The patient  reports that she has been smoking cigarettes. She has a 2.50 pack-year  smoking history. She has never used smokeless tobacco. She reports that she does not drink alcohol and does not use drugs.   Family History:  The patient's family history includes Aneurysm in her mother; Heart attack in her father; Heart disease in her father.    ROS:  Please see the history of present illness.   Otherwise, review of systems are positive for palpitations, back pain after gardening.   All other systems are reviewed and negative.    PHYSICAL EXAM: VS:  BP 122/76   Pulse 83   Ht '5\' 6"'$  (1.676 m)   Wt 271 lb 9.6 oz (123.2 kg)   SpO2 99%   BMI 43.84 kg/m  , BMI Body mass index is 43.84 kg/m. GEN: Well nourished, well developed, in no acute distress HEENT: normal Neck: no JVD, carotid bruits, or masses Cardiac: RRR; no murmurs, rubs, or gallops,no edema  Respiratory:  clear to auscultation bilaterally, normal work of breathing GI: soft, nontender, nondistended, + BS MS: no deformity or atrophy Skin: warm and dry, no rash Neuro:  Strength and sensation are intact Psych: euthymic mood, full affect   EKG:   The ekg ordered today demonstrates NSR, PVCs   Recent Labs: 07/25/2022: ALT 18; BUN 8; Creatinine, Ser 0.86; Hemoglobin 15.2; Platelets 154.0; Potassium 4.1; Sodium 138;  TSH 2.14   Lipid Panel    Component Value Date/Time   CHOL 99 07/25/2022 1032   TRIG 67.0 07/25/2022 1032   HDL 40.40 07/25/2022 1032   CHOLHDL 2 07/25/2022 1032   VLDL 13.4 07/25/2022 1032   LDLCALC 45 07/25/2022 1032     Other studies Reviewed: Additional studies/ records that were reviewed today with results demonstrating: labs reviewed.   ASSESSMENT AND PLAN:  Palpitations/PVCs: PVCs noted on prior ECG.  SHe felt premature beats during the exam.  Symptomatic OVCs.  HTN: The current medical regimen is effective;  continue present plan and medications. Hyperlipidemia: Whole food, plant based diet.  February 2024 LDL 45 HDL 40 total cholesterol 99 triglycerides 67. pescetarian.    High-fiber diet avoid processed foods. Avoid added sugar.   Morbid obesity: Continue swimming.  Diet as noted above.   Prediabetes February 2024 A1c 6.2.  We spoke about dietary changes as noted above. Tobacco abuse: Stopping smoking is most important prevention.  1 800 quit now. She is agreeable to a [place] scoring CT.  This may help with further prevention.    Current medicines are reviewed at length with the patient today.  The patient concerns regarding her medicines were addressed.  The following changes have been made:  No change  Labs/ tests ordered today include:  No orders of the defined types were placed in this encounter.   Recommend 150 minutes/week of aerobic exercise Low fat, low carb, high fiber diet recommended  Disposition:   FU in 1 year   Signed, Larae Grooms, MD  08/15/2022 9:57 AM    [facility] [address], [place], [place]  [address] Phone: [phone]; Fax: [phone]

## 2022-08-19 ENCOUNTER — Ambulatory Visit
Admission: RE | Admit: 2022-08-19 | Discharge: 2022-08-19 | Disposition: A | Discharge status: Home / Self Care | Payer: Medicare HMO | Source: Ambulatory Visit | Attending: Family | Admitting: Family

## 2022-08-19 DIAGNOSIS — Z780 Asymptomatic menopausal state: Secondary | ICD-10-CM

## 2022-09-02 ENCOUNTER — Ambulatory Visit (HOSPITAL_COMMUNITY)
Admission: RE | Admit: 2022-09-02 | Discharge: 2022-09-02 | Disposition: A | Discharge status: Home / Self Care | Payer: Medicare HMO | Source: Ambulatory Visit | Attending: Interventional Cardiology | Admitting: Interventional Cardiology

## 2022-09-02 DIAGNOSIS — R002 Palpitations: Secondary | ICD-10-CM | POA: Insufficient documentation

## 2022-09-02 DIAGNOSIS — E782 Mixed hyperlipidemia: Secondary | ICD-10-CM | POA: Insufficient documentation

## 2022-09-06 ENCOUNTER — Telehealth: Payer: Self-pay | Admitting: Interventional Cardiology

## 2022-09-06 NOTE — Telephone Encounter (Signed)
Patient was returning call. Please advise ?

## 2022-09-06 NOTE — Telephone Encounter (Signed)
Informed patient of results and verbal understanding expressed.  

## 2022-09-06 NOTE — Progress Notes (Signed)
Good afternoon,  I was looking over our mutual patients calcium score which noted an incidental finding of 1.4 cm irregular nodule in the lung with recommendation To order Ct chest. Would you like for me to get this ordered? Thank you again for seeing her.

## 2022-09-06 NOTE — Telephone Encounter (Signed)
Patient is returning call to discuss CT results. 

## 2022-09-06 NOTE — Telephone Encounter (Addendum)
Left message to call back  

## 2022-09-07 ENCOUNTER — Other Ambulatory Visit: Payer: Self-pay | Admitting: Family

## 2022-09-07 ENCOUNTER — Telehealth: Payer: Self-pay | Admitting: Family

## 2022-09-07 DIAGNOSIS — R911 Solitary pulmonary nodule: Secondary | ICD-10-CM

## 2022-09-07 NOTE — Progress Notes (Signed)
Good morning. Thank you both for your updates. I have placed the referral for the pulmonary nodule to you Dr. Loanne Drilling. Thank you Dr. Irish Lack for your quick response. I will defer the Ct vs Pet to Dr. Loanne Drilling.

## 2022-09-07 NOTE — Telephone Encounter (Signed)
Called pt.

## 2022-09-13 ENCOUNTER — Telehealth: Payer: Medicare HMO | Admitting: Physician Assistant

## 2022-09-13 ENCOUNTER — Encounter (HOSPITAL_BASED_OUTPATIENT_CLINIC_OR_DEPARTMENT_OTHER): Payer: Self-pay | Admitting: Pulmonary Disease

## 2022-09-13 ENCOUNTER — Ambulatory Visit (INDEPENDENT_AMBULATORY_CARE_PROVIDER_SITE_OTHER): Payer: Medicare HMO | Admitting: Pulmonary Disease

## 2022-09-13 VITALS — BP 140/78 | HR 65 | Temp 98.6°F | Ht 65.0 in | Wt 276.0 lb

## 2022-09-13 DIAGNOSIS — R911 Solitary pulmonary nodule: Secondary | ICD-10-CM | POA: Diagnosis not present

## 2022-09-13 DIAGNOSIS — M545 Low back pain, unspecified: Secondary | ICD-10-CM | POA: Diagnosis not present

## 2022-09-13 MED ORDER — BACLOFEN 10 MG PO TABS: 10.0000 mg | ORAL_TABLET | Freq: Three times a day (TID) | ORAL | 0 refills | Status: DC

## 2022-09-13 NOTE — Patient Instructions (Addendum)
Lanier Clam, thank you for joining Leeanne Rio, PA-C for today's virtual visit.  While this provider is not your primary care provider (PCP), if your PCP is located in our provider database this encounter information will be shared with them immediately following your visit.   [facility] account gives you access to today's visit and all your visits, tests, and labs performed at [facility] " click here if you don't have a [facility] account or go to [email or web address]  Consent: (Patient) Felicia Barr provided verbal consent for this virtual visit at the beginning of the encounter.  Current Medications:  Current Outpatient Medications:    amLODipine-valsartan (EXFORGE) 10-320 MG tablet, Take 1 tablet by mouth daily., Disp: 90 tablet, Rfl: 3   rosuvastatin (CRESTOR) 20 MG tablet, Take 1 tablet (20 mg total) by mouth daily., Disp: 90 tablet, Rfl: 3   Vitamin D, Ergocalciferol, (DRISDOL) 1.25 MG (50000 UNIT) CAPS capsule, Take 1 capsule (50,000 Units total) by mouth every 7 (seven) days., Disp: 8 capsule, Rfl: 0   Medications ordered in this encounter:  No orders of the defined types were placed in this encounter.    *If you need refills on other medications prior to your next appointment, please contact your pharmacy*  Follow-Up: Call back or seek an in-person evaluation if the symptoms worsen or if the condition fails to improve as anticipated.  [place] [phone]  Other Instructions Avoid heavy lifting and overexertion. Apply heating pad to lower back for 10-15 minutes, a few times per day. Ok to alternate OTC tylenol and ibuprofen when needed. Try to use an ergonomic chair when possible Start stretches below. Follow-up with your PCP or use resources below to be seen by specialist if symptoms are not resolving.   Low Back Sprain or Strain Rehab Ask your health care provider which exercises are safe for you. Do  exercises exactly as told by your health care provider and adjust them as directed. It is normal to feel mild stretching, pulling, tightness, or discomfort as you do these exercises. Stop right away if you feel sudden pain or your pain gets worse. Do not begin these exercises until told by your health care provider. Stretching and range-of-motion exercises These exercises warm up your muscles and joints and improve the movement and flexibility of your back. These exercises also help to relieve pain, numbness, and tingling. Lumbar rotation  Lie on your back on a firm bed or the floor with your knees bent. Straighten your arms out to your sides so each arm forms a 90-degree angle (right angle) with a side of your body. Slowly move (rotate) both of your knees to one side of your body until you feel a stretch in your lower back (lumbar). Try not to let your shoulders lift off the floor. Hold this position for __________ seconds. Tense your abdominal muscles and slowly move your knees back to the starting position. Repeat this exercise on the other side of your body. Repeat __________ times. Complete this exercise __________ times a day. Single knee to chest  Lie on your back on a firm bed or the floor with both legs straight. Bend one of your knees. Use your hands to move your knee up toward your chest until you feel a gentle stretch in your lower back and buttock. Hold your leg in this position by holding on to the front of your knee. Keep your other leg as straight as possible. Hold this  position for __________ seconds. Slowly return to the starting position. Repeat with your other leg. Repeat __________ times. Complete this exercise __________ times a day. Prone extension on elbows  Lie on your abdomen on a firm bed or the floor (prone position). Prop yourself up on your elbows. Use your arms to help lift your chest up until you feel a gentle stretch in your abdomen and your lower  back. This will place some of your body weight on your elbows. If this is uncomfortable, try stacking pillows under your chest. Your hips should stay down, against the surface that you are lying on. Keep your hip and back muscles relaxed. Hold this position for __________ seconds. Slowly relax your upper body and return to the starting position. Repeat __________ times. Complete this exercise __________ times a day. Strengthening exercises These exercises build strength and endurance in your back. Endurance is the ability to use your muscles for a long time, even after they get tired. Pelvic tilt This exercise strengthens the muscles that lie deep in the abdomen. Lie on your back on a firm bed or the floor with your legs extended. Bend your knees so they are pointing toward the ceiling and your feet are flat on the floor. Tighten your lower abdominal muscles to press your lower back against the floor. This motion will tilt your pelvis so your tailbone points up toward the ceiling instead of pointing to your feet or the floor. To help with this exercise, you may place a small towel under your lower back and try to push your back into the towel. Hold this position for __________ seconds. Let your muscles relax completely before you repeat this exercise. Repeat __________ times. Complete this exercise __________ times a day. Alternating arm and leg raises  Get on your hands and knees on a firm surface. If you are on a hard floor, you may want to use padding, such as an exercise mat, to cushion your knees. Line up your arms and legs. Your hands should be directly below your shoulders, and your knees should be directly below your hips. Lift your left leg behind you. At the same time, raise your right arm and straighten it in front of you. Do not lift your leg higher than your hip. Do not lift your arm higher than your shoulder. Keep your abdominal and back muscles tight. Keep your hips facing the  ground. Do not arch your back. Keep your balance carefully, and do not hold your breath. Hold this position for __________ seconds. Slowly return to the starting position. Repeat with your right leg and your left arm. Repeat __________ times. Complete this exercise __________ times a day. Abdominal set with straight leg raise  Lie on your back on a firm bed or the floor. Bend one of your knees and keep your other leg straight. Tense your abdominal muscles and lift your straight leg up, 4-6 inches (10-15 cm) off the ground. Keep your abdominal muscles tight and hold this position for __________ seconds. Do not hold your breath. Do not arch your back. Keep it flat against the ground. Keep your abdominal muscles tense as you slowly lower your leg back to the starting position. Repeat with your other leg. Repeat __________ times. Complete this exercise __________ times a day. Single leg lower with bent knees Lie on your back on a firm bed or the floor. Tense your abdominal muscles and lift your feet off the floor, one foot at a time, so your  knees and hips are bent in 90-degree angles (right angles). Your knees should be over your hips and your lower legs should be parallel to the floor. Keeping your abdominal muscles tense and your knee bent, slowly lower one of your legs so your toe touches the ground. Lift your leg back up to return to the starting position. Do not hold your breath. Do not let your back arch. Keep your back flat against the ground. Repeat with your other leg. Repeat __________ times. Complete this exercise __________ times a day. Posture and body mechanics Good posture and healthy body mechanics can help to relieve stress in your body's tissues and joints. Body mechanics refers to the movements and positions of your body while you do your daily activities. Posture is part of body mechanics. Good posture means: Your spine is in its natural S-curve position  (neutral). Your shoulders are pulled back slightly. Your head is not tipped forward (neutral). Follow these guidelines to improve your posture and body mechanics in your everyday activities. Standing  When standing, keep your spine neutral and your feet about hip-width apart. Keep a slight bend in your knees. Your ears, shoulders, and hips should line up. When you do a task in which you stand in one place for a long time, place one foot up on a stable object that is 2-4 inches (5-10 cm) high, such as a footstool. This helps keep your spine neutral. Sitting  When sitting, keep your spine neutral and keep your feet flat on the floor. Use a footrest, if necessary, and keep your thighs parallel to the floor. Avoid rounding your shoulders, and avoid tilting your head forward. When working at a desk or a computer, keep your desk at a height where your hands are slightly lower than your elbows. Slide your chair under your desk so you are close enough to maintain good posture. When working at a computer, place your monitor at a height where you are looking straight ahead and you do not have to tilt your head forward or downward to look at the screen. Resting When lying down and resting, avoid positions that are most painful for you. If you have pain with activities such as sitting, bending, stooping, or squatting, lie in a position in which your body does not bend very much. For example, avoid curling up on your side with your arms and knees near your chest (fetal position). If you have pain with activities such as standing for a long time or reaching with your arms, lie with your spine in a neutral position and bend your knees slightly. Try the following positions: Lying on your side with a pillow between your knees. Lying on your back with a pillow under your knees. Lifting  When lifting objects, keep your feet at least shoulder-width apart and tighten your abdominal muscles. Bend your knees and hips  and keep your spine neutral. It is important to lift using the strength of your legs, not your back. Do not lock your knees straight out. Always ask for help to lift heavy or awkward objects. This information is not intended to replace advice given to you by your health care provider. Make sure you discuss any questions you have with your health care provider. Document Revised: 08/24/2020 Document Reviewed: 08/24/2020 Elsevier Patient Education  [place].    If you have been instructed to have an in-person evaluation today at a local Urgent Care facility, please use the link below. It will take you  to a list of all of our available [facility], including address, phone number and hours of operation. Please do not delay care.  [facility]  If you or a family member do not have a primary care provider, use the link below to schedule a visit and establish care. When you choose a [facility] primary care physician or advanced practice provider, you gain a long-term partner in health. Find a Primary Care Provider  Learn more about [facility] in-office and virtual care options: [facility]

## 2022-09-13 NOTE — Progress Notes (Signed)
Subjective:   PATIENT ID: Felicia Barr GENDER: female DOB: 1956/02/04, MRN: [id number]  Chief Complaint  Patient presents with   Consult    Patient here to discuss CT and PET scan. No complaints.     Reason for Visit: New consult for left upper lobe nodule  Ms. [name] is a 67 year old female active smoker with HTN, HLD, hx uterine fibroids who presents as for evaluation for lung nodule  She was initially referred to Cardiology for palpitation which she has a had prior work-up including negative stress test in 2019. She completed CT Cardiac and incidentally found with LUL nodule measuring 1.4 cm. She is active smoker with black and mild cigars two a day for the last 13 years. Has never been diagnosed with COPD and asthma. Denies respiratory issues during childhood. 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 No limitation in activities.   Social History: Active smoker. 20 years 1/2 ppd. She is active smoker with black and mild cigars two a day for the last 13 years Retired Education officer, museum  I have personally reviewed patient's past medical/family/social history, allergies, current medications.  Past Medical History:  Diagnosis Date   Hyperlipidemia    Hypertension      Family History  Problem Relation Age of Onset   Aneurysm Mother    Heart disease Father    Heart attack Father        in his 9's, second MI age 54     Social History   Occupational History   Occupation: retired  Tobacco Use   Smoking status: Every Day    Packs/day: 0.25    Years: 10.00    Additional pack years: 0.00    Total pack years: 2.50    Types: Cigarettes   Smokeless tobacco: Never  Vaping Use   Vaping Use: Never used  Substance and Sexual Activity   Alcohol use: Never   Drug use: Never   Sexual activity: Yes    Partners: Male    Birth control/protection: Post-menopausal    No Known Allergies   Outpatient Medications Prior to Visit  Medication Sig Dispense Refill    amLODipine-valsartan (EXFORGE) 10-320 MG tablet Take 1 tablet by mouth daily. 90 tablet 3   rosuvastatin (CRESTOR) 20 MG tablet Take 1 tablet (20 mg total) by mouth daily. 90 tablet 3   Vitamin D, Ergocalciferol, (DRISDOL) 1.25 MG (50000 UNIT) CAPS capsule Take 1 capsule (50,000 Units total) by mouth every 7 (seven) days. 8 capsule 0   No facility-administered medications prior to visit.    Review of Systems  Constitutional:  Negative for chills, diaphoresis, fever, malaise/fatigue and weight loss.  HENT:  Negative for congestion.   Respiratory:  Negative for cough, hemoptysis, sputum production, shortness of breath and wheezing.   Cardiovascular:  Negative for chest pain, palpitations and leg swelling.     Objective:   Vitals:   09/13/22 0950  BP: (!) 140/78  Pulse: 65  Temp: 98.6 F (37 C)  TempSrc: Oral  SpO2: 100%  Weight: 276 lb (125.2 kg)  Height: 5\' 5"  (1.651 m)   SpO2: 100 % O2 Device: None (Room air)  Physical Exam: General: Well-appearing, no acute distress HENT: [place], AT Eyes: EOMI, no scleral icterus Respiratory: Clear to auscultation bilaterally.  No crackles, wheezing or rales Cardiovascular: RRR, -M/R/G, no JVD Extremities:-Edema,-tenderness Neuro: AAO x4, CNII-XII grossly intact Psych: Normal mood, normal affect  Data Reviewed:  Imaging: CT Cardiac 09/02/22 - Visualized lung  parenchyma with 1.4 cm subsolid nodule in left upper lobe. no pulmonary nodules, masses, infiltrate, effusion or pneumothorax.  PFT: None of file  Labs: CBC    Component Value Date/Time   WBC 4.2 07/25/2022 1032   RBC 4.49 07/25/2022 1032   HGB 15.2 (H) 07/25/2022 1032   HCT 44.3 07/25/2022 1032   PLT 154.0 07/25/2022 1032   MCV 98.5 07/25/2022 1032   MCHC 34.3 07/25/2022 1032   RDW 14.4 07/25/2022 1032   BMET    Component Value Date/Time   NA 138 07/25/2022 1032   K 4.1 07/25/2022 1032   CL 105 07/25/2022 1032   CO2 26 07/25/2022 1032   GLUCOSE 102 (H) 07/25/2022  1032   BUN 8 07/25/2022 1032   CREATININE 0.86 07/25/2022 1032   CALCIUM 9.1 07/25/2022 1032        Assessment & Plan:   Discussion: 67 year old female active smoker with HTN, HLD, hx uterine fibroids who presents as for evaluation for left upper lobe lung nodule. Asymptomatic. Risk for malignancy assessed to be low-intermediate on Mayo calculator. We discussed risks and benefits of diagnostic testing vs surveillance imaging. Also discussed CT +/- PET. After shared decision making, will plan to pursue repeat CT Chest with contrast. If needed, will order PET at next visit for further evaluation.   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 Discussed risks and benefits --ORDER CT Chest with contrast --Consider PET in the future  Health Maintenance Immunization History  Administered Date(s) Administered   Influenza,inj,Quad PF,6+ Mos 05/12/2017, 04/16/2020   PFIZER(Purple Top)SARS-COV-2 Vaccination 08/31/2019, 09/23/2019   Pneumococcal Conjugate-13 10/05/2021   Tdap 10/05/2021   CT Lung Screen - not qualified  Orders Placed This Encounter  Procedures   CT Chest W Contrast    Standing Status:   Future    Standing Expiration Date:   09/13/2023    Scheduling Instructions:     Schedule in 3 months (June 2024)    Order Specific Question:   If indicated for the ordered procedure, I authorize the administration of contrast media per Radiology protocol    Answer:   Yes    Order Specific Question:   Does the patient have a contrast media/X-ray dye allergy?    Answer:   No    Order Specific Question:   Preferred imaging location?    Answer:   [facility]  No orders of the defined types were placed in this encounter.   Return in about 3 months (around 12/14/2022) for after CT scan, with Dr. Loanne Drilling.  I have spent a total time of 45-minutes on the day of the appointment reviewing prior documentation, coordinating care and discussing  medical diagnosis and plan with the patient/family. Imaging, labs and tests included in this note have been reviewed and interpreted independently by me.  [place], MD [facility] 09/13/2022 12:31 PM  Office Number [phone]

## 2022-09-13 NOTE — Patient Instructions (Signed)
Left upper lung nodule 1.4 cm --ORDER CT Chest with contrast --Consider PET in the future

## 2022-09-13 NOTE — Progress Notes (Signed)
Virtual Visit Consent   Felicia Barr, you are scheduled for a virtual visit with a [facility] provider today. Just as with appointments in the office, your consent must be obtained to participate. Your consent will be active for this visit and any virtual visit you may have with one of our providers in the next 365 days. If you have a MyChart account, a copy of this consent can be sent to you electronically.  As this is a virtual visit, video technology does not allow for your provider to perform a traditional examination. This may limit your provider's ability to fully assess your condition. If your provider identifies any concerns that need to be evaluated in person or the need to arrange testing (such as labs, EKG, etc.), we will make arrangements to do so. Although advances in technology are sophisticated, we cannot ensure that it will always work on either your end or our end. If the connection with a video visit is poor, the visit may have to be switched to a telephone visit. With either a video or telephone visit, we are not always able to ensure that we have a secure connection.  By engaging in this virtual visit, you consent to the provision of healthcare and authorize for your insurance to be billed (if applicable) for the services provided during this visit. Depending on your insurance coverage, you may receive a charge related to this service.  I need to obtain your verbal consent now. Are you willing to proceed with your visit today? Felicia Barr has provided verbal consent on 09/13/2022 for a virtual visit (video or telephone). Felicia Barr, [place]  Date: 09/13/2022 2:05 PM  Virtual Visit via Video Note   I, Felicia Barr, connected with  Felicia Barr  ([id number], 65-Sep-1957) on 09/13/22 at  2:00 PM EDT by a video-enabled telemedicine application and verified that I am speaking with the correct person using two identifiers.  Location: Patient: Virtual Visit Location Patient:  Home Provider: Virtual Visit Location Provider: Home Office   I discussed the limitations of evaluation and management by telemedicine and the availability of in person appointments. The patient expressed understanding and agreed to proceed.    History of Present Illness: Felicia Barr is a 67 y.o. who identifies as a female who was assigned female at birth, and is being seen today for some lower back pain over the past few weeks after overexerting herself working in her flower garden. Denies any trauma. Pain is lower and bilateral without radiation into lower extremities. Denies any numbness/tingling. Pain was more severe the first week but has improved substantially since that time. Denies any difficulty with ambulation. Now pain is mainly gone except in times of prolonged sitting where lower back gets very stiff. Ambulation helps with this.   HPI: HPI  Problems:  Patient Active Problem List   Diagnosis Date Noted   Incidental lung nodule, greater than or equal to 23mm 09/13/2022   History of iron deficiency anemia 07/25/2022   Vitamin D deficiency 07/25/2022   Mixed hyperlipidemia 07/25/2022   Prediabetes 07/25/2022   Primary hypertension 07/25/2022   Uterine leiomyoma 07/25/2022   Irregular heart rhythm 07/25/2022   Palpitations 07/25/2022   Abnormal EKG 07/25/2022    Allergies: No Known Allergies Medications:  Current Outpatient Medications:    baclofen (LIORESAL) 10 MG tablet, Take 1 tablet (10 mg total) by mouth 3 (three) times daily., Disp: 15 each, Rfl: 0   amLODipine-valsartan (EXFORGE) 10-320 MG tablet, Take 1 tablet  by mouth daily., Disp: 90 tablet, Rfl: 3   rosuvastatin (CRESTOR) 20 MG tablet, Take 1 tablet (20 mg total) by mouth daily., Disp: 90 tablet, Rfl: 3   Vitamin D, Ergocalciferol, (DRISDOL) 1.25 MG (50000 UNIT) CAPS capsule, Take 1 capsule (50,000 Units total) by mouth every 7 (seven) days., Disp: 8 capsule, Rfl: 0  Observations/Objective: Patient is  well-developed, well-nourished in no acute distress.  Resting comfortably in parked car. Head is normocephalic, atraumatic.  No labored breathing.  Speech is clear and coherent with logical content.  Patient is alert and oriented at baseline.   Assessment and Plan: 1. Acute bilateral low back pain without sciatica - baclofen (LIORESAL) 10 MG tablet; Take 1 tablet (10 mg total) by mouth 3 (three) times daily.  Dispense: 15 each; Refill: 0  Supportive measures and OTC medications reviewed. Stretching exercises reviewed and handout sent through [facility]. Trial of Baclofen in evening for next couple of nights to release residual muscle tension. Heating pad recommended in 15-minute intervals. Resources for same-day Ortho/Sports Medicine evaluation given in case symptoms are not continuing to resolve.   Follow Up Instructions: I discussed the assessment and treatment plan with the patient. The patient was provided an opportunity to ask questions and all were answered. The patient agreed with the plan and demonstrated an understanding of the instructions.  A copy of instructions were sent to the patient via MyChart unless otherwise noted below.   The patient was advised to call back or seek an in-person evaluation if the symptoms worsen or if the condition fails to improve as anticipated.  Time:  I spent 10 minutes with the patient via telehealth technology discussing the above problems/concerns.    Felicia Rio, PA-C

## 2022-09-14 ENCOUNTER — Other Ambulatory Visit (HOSPITAL_COMMUNITY)
Admission: RE | Admit: 2022-09-14 | Discharge: 2022-09-14 | Disposition: A | Discharge status: Home / Self Care | Payer: Medicare HMO | Source: Ambulatory Visit | Attending: Family Medicine | Admitting: Family Medicine

## 2022-09-14 ENCOUNTER — Ambulatory Visit (INDEPENDENT_AMBULATORY_CARE_PROVIDER_SITE_OTHER): Payer: Medicare HMO | Admitting: Family Medicine

## 2022-09-14 ENCOUNTER — Encounter: Payer: Self-pay | Admitting: Family Medicine

## 2022-09-14 VITALS — BP 139/71 | HR 72 | Ht 65.0 in | Wt 275.0 lb

## 2022-09-14 DIAGNOSIS — Z124 Encounter for screening for malignant neoplasm of cervix: Secondary | ICD-10-CM

## 2022-09-14 DIAGNOSIS — Z01419 Encounter for gynecological examination (general) (routine) without abnormal findings: Secondary | ICD-10-CM | POA: Diagnosis not present

## 2022-09-14 DIAGNOSIS — Z1151 Encounter for screening for human papillomavirus (HPV): Secondary | ICD-10-CM | POA: Insufficient documentation

## 2022-09-14 NOTE — Progress Notes (Signed)
Patient presents for Annual.  Pt will like to have Annual labs.  From [place], [place].  LMP: Post Menopausal  Last pap: Date: 03/30/21 WNL  Mammogram:  10/04/22Scheduled 09/27/22 STD Screening: Declines Flu Vaccine :  09/23  CC:  Fibroids and Pt had PMB had EMB x 2, got IUD  still had bleeding had IUD removed. No longer bleeding since having IUD Removed in 2023.  Fun Fact: Patient loves Gardening  and teaching reading.

## 2022-09-14 NOTE — Progress Notes (Signed)
Subjective:     Felicia Barr is a 67 y.o. female and is here for a comprehensive physical exam. The patient reports no problems. Has h/o PMB with polyp noted and fibroids. Had D & C with hysteroscopy (last sampling 2022 - in [place]) and IUD insertion, but this caused more bleeding and was removed and was likely not in endometrial cavity. No further bleeding.  The following portions of the patient's history were reviewed and updated as appropriate: allergies, current medications, past family history, past medical history, past social history, past surgical history, and problem list.  Review of Systems Pertinent items noted in HPI and remainder of comprehensive ROS otherwise negative.   Objective:  Chaperone present for exam   BP 139/71   Pulse 72   Ht 5\' 5"  (1.651 m)   Wt 275 lb (124.7 kg)   BMI 45.76 kg/m  General appearance: alert, cooperative, and appears stated age Head: Normocephalic, without obvious abnormality, atraumatic Neck: no adenopathy, supple, symmetrical, trachea midline, and thyroid not enlarged, symmetric, no tenderness/mass/nodules Lungs: clear to auscultation bilaterally Breasts: normal appearance, no masses or tenderness Heart: regular rate and rhythm, S1, S2 normal, no murmur, click, rub or gallop Abdomen: soft, non-tender; bowel sounds normal; no masses,  no organomegaly Pelvic: cervix normal in appearance, external genitalia normal, no adnexal masses or tenderness, no cervical motion tenderness, uterus normal size, shape, and consistency, and vagina normal without discharge Extremities: extremities normal, atraumatic, no cyanosis or edema Pulses: 2+ and symmetric Skin: Skin color, texture, turgor normal. No rashes or lesions Lymph nodes: Cervical, supraclavicular, and axillary nodes normal. Neurologic: Grossly normal    Assessment:    GYN female exam.      Plan:  Encounter for gynecological examination without abnormal finding  Screening for  malignant neoplasm of cervix - Plan: Cytology - PAP    See After Visit Summary for Counseling Recommendations

## 2022-09-19 LAB — CYTOLOGY - PAP
Comment: NEGATIVE
Diagnosis: NEGATIVE
High risk HPV: NEGATIVE

## 2022-09-27 ENCOUNTER — Ambulatory Visit
Admission: RE | Admit: 2022-09-27 | Discharge: 2022-09-27 | Disposition: A | Discharge status: Home / Self Care | Payer: Medicare HMO | Source: Ambulatory Visit | Attending: Family | Admitting: Family

## 2022-09-27 DIAGNOSIS — Z1231 Encounter for screening mammogram for malignant neoplasm of breast: Secondary | ICD-10-CM

## 2022-10-24 ENCOUNTER — Ambulatory Visit (INDEPENDENT_AMBULATORY_CARE_PROVIDER_SITE_OTHER): Payer: Medicare HMO | Admitting: Family

## 2022-10-24 VITALS — BP 128/84 | HR 76 | Temp 97.7°F | Ht 65.0 in | Wt 273.0 lb

## 2022-10-24 DIAGNOSIS — E559 Vitamin D deficiency, unspecified: Secondary | ICD-10-CM

## 2022-10-24 DIAGNOSIS — D582 Other hemoglobinopathies: Secondary | ICD-10-CM

## 2022-10-24 DIAGNOSIS — F17290 Nicotine dependence, other tobacco product, uncomplicated: Secondary | ICD-10-CM

## 2022-10-24 DIAGNOSIS — R079 Chest pain, unspecified: Secondary | ICD-10-CM | POA: Insufficient documentation

## 2022-10-24 DIAGNOSIS — K644 Residual hemorrhoidal skin tags: Secondary | ICD-10-CM | POA: Insufficient documentation

## 2022-10-24 DIAGNOSIS — I499 Cardiac arrhythmia, unspecified: Secondary | ICD-10-CM

## 2022-10-24 DIAGNOSIS — E782 Mixed hyperlipidemia: Secondary | ICD-10-CM | POA: Diagnosis not present

## 2022-10-24 DIAGNOSIS — R252 Cramp and spasm: Secondary | ICD-10-CM | POA: Insufficient documentation

## 2022-10-24 DIAGNOSIS — D72819 Decreased white blood cell count, unspecified: Secondary | ICD-10-CM | POA: Insufficient documentation

## 2022-10-24 DIAGNOSIS — R319 Hematuria, unspecified: Secondary | ICD-10-CM | POA: Insufficient documentation

## 2022-10-24 DIAGNOSIS — R002 Palpitations: Secondary | ICD-10-CM

## 2022-10-24 DIAGNOSIS — M549 Dorsalgia, unspecified: Secondary | ICD-10-CM | POA: Insufficient documentation

## 2022-10-24 DIAGNOSIS — D509 Iron deficiency anemia, unspecified: Secondary | ICD-10-CM | POA: Insufficient documentation

## 2022-10-24 DIAGNOSIS — R7303 Prediabetes: Secondary | ICD-10-CM

## 2022-10-24 DIAGNOSIS — M25569 Pain in unspecified knee: Secondary | ICD-10-CM | POA: Insufficient documentation

## 2022-10-24 DIAGNOSIS — R911 Solitary pulmonary nodule: Secondary | ICD-10-CM

## 2022-10-24 DIAGNOSIS — E876 Hypokalemia: Secondary | ICD-10-CM | POA: Insufficient documentation

## 2022-10-24 DIAGNOSIS — D500 Iron deficiency anemia secondary to blood loss (chronic): Secondary | ICD-10-CM

## 2022-10-24 MED ORDER — NICOTINE 21 MG/24HR TD PT24: 21.0000 mg | MEDICATED_PATCH | Freq: Every day | TRANSDERMAL | 1 refills | Status: AC

## 2022-10-24 NOTE — Assessment & Plan Note (Signed)
Start nicotine patch 21 mg once daily  Try to d/c cold [place] cigars  F/u in four weeks  Smoking cessation instruction/counseling given:  counseled patient on the dangers of tobacco use, advised patient to stop smoking, and reviewed strategies to maximize success

## 2022-10-24 NOTE — Assessment & Plan Note (Deleted)
Frequent on PE today Reviewed cardiology note from 2/24, were not heard during that exam per PE

## 2022-10-24 NOTE — Assessment & Plan Note (Signed)
Advised to start daily vitamin D3 2000 IU otc

## 2022-10-24 NOTE — Assessment & Plan Note (Signed)
Repeat A1c today pending results Pt advised of the following: Work on a diabetic diet, try to incorporate exercise at least 20-30 a day for 3 days a week or more.

## 2022-10-24 NOTE — Assessment & Plan Note (Signed)
Keep repeat Ct scheduled.

## 2022-10-24 NOTE — Assessment & Plan Note (Signed)
Repeat cbc today 

## 2022-10-24 NOTE — Patient Instructions (Addendum)
  Start daily vitamin D3 2000 IU once daily over the counter.   Start patch once daily. Follow up in four weeks.  Try to stop the cigars once you start the patch.   Regards,   Mort Sawyers FNP-C

## 2022-10-24 NOTE — Assessment & Plan Note (Addendum)
Frequent on PE today however unable to perform EKG in office due to our machines being down.  Pt does admit had caffeine this am.  Did advise pt to f/u with cardiologist as she also states has noticed them more frequently in the last few weeks especially.  Reviewed cardiology note from 2/24, were not heard during that exam per PE

## 2022-10-24 NOTE — Progress Notes (Signed)
Established Patient Office Visit  Subjective:      CC:  Chief Complaint  Patient presents with   Medical Management of Chronic Issues    HPI: Felicia Barr is a 67 y.o. female presenting on 10/24/2022 for Medical Management of Chronic Issues . Recently completed bone density.   Still smoking, trying to stop on her own.  She is currently smoking two cigars a day.  Recent finding on Ct cardiac scoring with 1.4 cm left upper lob irregular nodular lesion. She has a CT chest scheduled for 12/05/2022.   Vitamin d def: completed vitamin D3 50000 iu. Not currently taking otc vitamin D.   Prediabetes: does a lot of gardening outside. She is trying to swim a few times during the week.   Lab Results  Component Value Date   HGBA1C 6.2 07/25/2022    Irregular heart beat, seen for evaluation 2/26 for palpitations, was having some DOE however she states this has improved since last evaluated with him. She does state the palpitations are more frequent when stressed.      Social history:  Relevant past medical, surgical, family and social history reviewed and updated as indicated. Interim medical history since our last visit reviewed.  Allergies and medications reviewed and updated.  DATA REVIEWED: CHART IN EPIC     ROS: Negative unless specifically indicated above in HPI.    Current Outpatient Medications:    amLODipine-valsartan (EXFORGE) 10-320 MG tablet, Take 1 tablet by mouth daily., Disp: 90 tablet, Rfl: 3   nicotine (NICODERM CQ - DOSED IN MG/24 HOURS) 21 mg/24hr patch, Place 1 patch (21 mg total) onto the skin daily., Disp: 28 patch, Rfl: 1   rosuvastatin (CRESTOR) 20 MG tablet, Take 1 tablet (20 mg total) by mouth daily., Disp: 90 tablet, Rfl: 3      Objective:    BP 128/84 (BP Location: Left Arm)   Pulse 76   Temp 97.7 F (36.5 C) (Temporal)   Ht 5\' 5"  (1.651 m)   Wt 273 lb (123.8 kg)   SpO2 98%   BMI 45.43 kg/m   Wt Readings from Last 3 Encounters:   10/24/22 273 lb (123.8 kg)  09/14/22 275 lb (124.7 kg)  09/13/22 276 lb (125.2 kg)    Physical Exam Constitutional:      General: She is not in acute distress.    Appearance: Normal appearance. She is obese. She is not ill-appearing, toxic-appearing or diaphoretic.  HENT:     Head: Normocephalic.  Cardiovascular:     Rate and Rhythm: Normal rate. Rhythm irregularly irregular.  Pulmonary:     Effort: Pulmonary effort is normal.  Musculoskeletal:        General: Normal range of motion.     Right lower leg: No edema.     Left lower leg: No edema.  Neurological:     General: No focal deficit present.     Mental Status: She is alert and oriented to person, place, and time. Mental status is at baseline.  Psychiatric:        Mood and Affect: Mood normal.        Behavior: Behavior normal.        Thought Content: Thought content normal.        Judgment: Judgment normal.            Assessment & Plan:  Cigar smoker motivated to quit Assessment & Plan: Start nicotine patch 21 mg once daily  Try to d/c cold [place] cigars  F/u in four weeks  Smoking cessation instruction/counseling given:  counseled patient on the dangers of tobacco use, advised patient to stop smoking, and reviewed strategies to maximize success   Orders: -     Nicotine; Place 1 patch (21 mg total) onto the skin daily.  Dispense: 28 patch; Refill: 1  Elevated hemoglobin (HCC) -     Vitamin B12  Vitamin D deficiency Assessment & Plan: Advised to start daily vitamin D3 2000 IU otc  Orders: -     VITAMIN D 25 Hydroxy (Vit-D Deficiency, Fractures)  Mixed hyperlipidemia  Prediabetes Assessment & Plan: Repeat A1c today pending results Pt advised of the following: Work on a diabetic diet, try to incorporate exercise at least 20-30 a day for 3 days a week or more.    Orders: -     Hemoglobin A1c  Irregular heart rhythm Assessment & Plan: Frequent on PE today however unable to perform EKG in office  due to our machines being down.  Pt does admit had caffeine this am.  Did advise pt to f/u with cardiologist as she also states has noticed them more frequently in the last few weeks especially.  Reviewed cardiology note from 2/24, were not heard during that exam per PE   Orders: -     EKG 12-Lead  Iron deficiency anemia due to chronic blood loss Assessment & Plan: Repeat cbc today    Palpitations  Incidental lung nodule, greater than or equal to 8mm Assessment & Plan: Keep repeat Ct scheduled.       Return in about 4 weeks (around 11/21/2022) for f/u smoking cessation .  Mort Sawyers, MSN, APRN, FNP-C [facility]

## 2022-10-26 ENCOUNTER — Ambulatory Visit (INDEPENDENT_AMBULATORY_CARE_PROVIDER_SITE_OTHER): Payer: Medicare HMO

## 2022-10-26 ENCOUNTER — Telehealth: Payer: Self-pay | Admitting: *Deleted

## 2022-10-26 ENCOUNTER — Ambulatory Visit: Payer: Medicare HMO | Attending: Internal Medicine | Admitting: *Deleted

## 2022-10-26 VITALS — BP 128/50 | HR 67 | Ht 65.0 in | Wt 275.0 lb

## 2022-10-26 DIAGNOSIS — R002 Palpitations: Secondary | ICD-10-CM

## 2022-10-26 NOTE — Telephone Encounter (Signed)
-----   Message from Corky Crafts, MD sent at 10/25/2022  8:57 AM EDT ----- Can we get her in for nurse visit, ECG?  If we do see AFib, may need to see DOD.  ----- Message ----- From: Mort Sawyers, FNP Sent: 10/24/2022  11:42 AM EDT To: Corky Crafts, MD  Good morning Dr. Eldridge Dace, I seen our mutual patient Felicia Barr in office today. Irregularly irregular heartbeat on exam today, more frequent than usual. Unable to perform EKG in office because our machines were down. Possible auscultation suggestive of afib. I did advise her to call your office to schedule office visit. Just wanted to give you a heads up as well.

## 2022-10-26 NOTE — Progress Notes (Unsigned)
Enrolled for [employer] to mail a ZIO XT long term holter monitor to the patients address on file.  

## 2022-10-26 NOTE — Progress Notes (Signed)
ekg  Nurse Visit   Date of Encounter: 10/26/2022 ID: Felicia Barr, DOB 1956/03/02, MRN [id number]  PCP:  Mort Sawyers, FNP   [facility] Providers Cardiologist:  Lance Muss, MD      Visit Details   VS:  BP (!) 128/50   Pulse 67   Ht 5\' 5"  (1.651 m)   Wt 275 lb (124.7 kg)   BMI 45.76 kg/m  , BMI Body mass index is 45.76 kg/m.  Wt Readings from Last 3 Encounters:  10/26/22 275 lb (124.7 kg)  10/24/22 273 lb (123.8 kg)  09/14/22 275 lb (124.7 kg)     Reason for visit: EKG Performed today: Vitals, EKG, Provider consulted:Dr Turner, and Education Changes (medications, testing, etc.) : 2 week zio ordered Length of Visit:40 minutes    Medications Adjustments/Labs and Tests Ordered:  2 week zio ordered  Patient reports palpitations are less today.  She is aware monitor will be mailed to her.  Signed, Genice Rouge, RN  10/26/2022 11:35 AM

## 2022-10-26 NOTE — Telephone Encounter (Signed)
I spoke with patient.  She will come in for EKG this morning

## 2022-10-31 DIAGNOSIS — R002 Palpitations: Secondary | ICD-10-CM | POA: Diagnosis not present

## 2022-11-01 ENCOUNTER — Other Ambulatory Visit: Payer: Medicare HMO

## 2022-11-01 DIAGNOSIS — D582 Other hemoglobinopathies: Secondary | ICD-10-CM

## 2022-11-01 DIAGNOSIS — E559 Vitamin D deficiency, unspecified: Secondary | ICD-10-CM

## 2022-11-01 DIAGNOSIS — R7303 Prediabetes: Secondary | ICD-10-CM

## 2022-11-02 ENCOUNTER — Other Ambulatory Visit
Admission: RE | Admit: 2022-11-02 | Discharge: 2022-11-02 | Disposition: A | Discharge status: Home / Self Care | Payer: Medicare HMO | Attending: Family | Admitting: Family

## 2022-11-02 DIAGNOSIS — E559 Vitamin D deficiency, unspecified: Secondary | ICD-10-CM | POA: Insufficient documentation

## 2022-11-02 DIAGNOSIS — D582 Other hemoglobinopathies: Secondary | ICD-10-CM | POA: Diagnosis present

## 2022-11-02 DIAGNOSIS — R7303 Prediabetes: Secondary | ICD-10-CM | POA: Diagnosis present

## 2022-11-02 LAB — VITAMIN B12: Vitamin B-12: 466 pg/mL (ref 180–914)

## 2022-11-02 LAB — HEMOGLOBIN A1C
Hgb A1c MFr Bld: 6.1 % — ABNORMAL HIGH (ref 4.8–5.6)
Mean Plasma Glucose: 128.37 mg/dL

## 2022-11-02 NOTE — Addendum Note (Signed)
Addended by: Lonell Face C on: 11/02/2022 10:55 AM   Modules accepted: Orders

## 2022-11-02 NOTE — Addendum Note (Signed)
Addended by: Tyller Bowlby C on: 11/02/2022 10:55 AM   Modules accepted: Orders  

## 2022-11-02 NOTE — Addendum Note (Signed)
Addended by: Ashani Pumphrey C on: 11/02/2022 10:55 AM   Modules accepted: Orders  

## 2022-11-10 LAB — VITAMIN D 25 HYDROXY (VIT D DEFICIENCY, FRACTURES)

## 2022-12-05 ENCOUNTER — Ambulatory Visit (HOSPITAL_COMMUNITY)
Admission: RE | Admit: 2022-12-05 | Discharge: 2022-12-05 | Disposition: A | Discharge status: Home / Self Care | Payer: Medicare HMO | Source: Ambulatory Visit | Attending: Pulmonary Disease | Admitting: Pulmonary Disease

## 2022-12-05 DIAGNOSIS — R911 Solitary pulmonary nodule: Secondary | ICD-10-CM

## 2022-12-05 MED ORDER — IOHEXOL 350 MG/ML SOLN
50.0000 mL | Freq: Once | INTRAVENOUS | Status: AC | PRN
  Administered 2022-12-05: 50 mL via INTRAVENOUS

## 2022-12-28 ENCOUNTER — Ambulatory Visit (HOSPITAL_BASED_OUTPATIENT_CLINIC_OR_DEPARTMENT_OTHER): Payer: Medicare HMO | Admitting: Pulmonary Disease

## 2022-12-28 ENCOUNTER — Encounter (HOSPITAL_BASED_OUTPATIENT_CLINIC_OR_DEPARTMENT_OTHER): Payer: Self-pay | Admitting: Pulmonary Disease

## 2022-12-28 VITALS — BP 140/60 | HR 69 | Temp 98.8°F | Ht 65.0 in | Wt 277.6 lb

## 2022-12-28 DIAGNOSIS — R911 Solitary pulmonary nodule: Secondary | ICD-10-CM

## 2022-12-28 NOTE — Progress Notes (Signed)
Subjective:   PATIENT ID: Felicia Barr GENDER: female DOB: 1956-04-06, MRN: [id number]  Chief Complaint  Patient presents with   Follow-up    Follow up. Patient has no complaints.     Reason for Visit: Follow-up CT  Ms. Felicia Barr is a 67 year old female active smoker with HTN, HLD, hx uterine fibroids who presents as for follow-up lung nodule  Initial consult She was initially referred to Cardiology for palpitation which she has a had prior work-up including negative stress test in 2019. She completed CT Cardiac and incidentally found with LUL nodule measuring 1.4 cm. She is active smoker with black and mild cigars two a day for the last 13 years. Has never been diagnosed with COPD and asthma. Denies respiratory issues during childhood. 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 No limitation in activities.   01/02/23 No changes since last visit. Nercous about her scan. Denies any respiratory symptoms. No weight loss, night sweats or unexplained fevers/chills.  Social History: Active smoker. 20 years 1/2 ppd. She is active smoker with black and mild cigars two a day for the last 13 years Retired Engineer, site  Past Medical History:  Diagnosis Date   Fibroids    Hyperlipidemia    Hypertension      Family History  Problem Relation Age of Onset   Aneurysm Mother    Heart disease Father    Heart attack Father        in his 37's, second MI age 47     Social History   Occupational History   Occupation: retired  Tobacco Use   Smoking status: Former    Packs/day: 0.25    Years: 10.00    Additional pack years: 0.00    Total pack years: 2.50    Types: Cigarettes    Quit date: 09/07/2022    Years since quitting: 0.3   Smokeless tobacco: Former  Building services engineer Use: Never used  Substance and Sexual Activity   Alcohol use: Never   Drug use: Never   Sexual activity: Yes    Partners: Male    Birth control/protection: Post-menopausal    No Known Allergies    Outpatient Medications Prior to Visit  Medication Sig Dispense Refill   amLODipine-valsartan (EXFORGE) 10-320 MG tablet Take 1 tablet by mouth daily. 90 tablet 3   nicotine (NICODERM CQ - DOSED IN MG/24 HOURS) 21 mg/24hr patch Place 1 patch (21 mg total) onto the skin daily. 28 patch 1   rosuvastatin (CRESTOR) 20 MG tablet Take 1 tablet (20 mg total) by mouth daily. 90 tablet 3   No facility-administered medications prior to visit.    Review of Systems  Constitutional:  Negative for chills, diaphoresis, fever, malaise/fatigue and weight loss.  HENT:  Negative for congestion.   Respiratory:  Negative for cough, hemoptysis, sputum production, shortness of breath and wheezing.   Cardiovascular:  Negative for chest pain, palpitations and leg swelling.     Objective:   Vitals:   12/28/22 1307  BP: (!) 140/60  Pulse: 69  Temp: 98.8 F (37.1 C)  TempSrc: Oral  SpO2: 99%  Weight: 277 lb 9.6 oz (125.9 kg)  Height: 5\' 5"  (1.651 m)   SpO2: 99 % O2 Device: None (Room air)  Physical Exam: General: Well-appearing, no acute distress HENT: [place], AT Eyes: EOMI, no scleral icterus Respiratory: Clear to auscultation bilaterally.  No crackles, wheezing or rales Cardiovascular: RRR, -M/R/G, no JVD Extremities:-Edema,-tenderness Neuro: AAO  x4, CNII-XII grossly intact Psych: Normal mood, normal affect  Data Reviewed:  Imaging: CT Cardiac 09/02/22 - Visualized lung parenchyma with 1.4 cm subsolid nodule in left upper lobe. CT Chest 12/05/22 - Stable subsolid nodule measuring 1.4 x 0.8 cm.  PFT: None of file  Labs: CBC    Component Value Date/Time   WBC 4.2 07/25/2022 1032   RBC 4.49 07/25/2022 1032   HGB 15.2 (H) 07/25/2022 1032   HCT 44.3 07/25/2022 1032   PLT 154.0 07/25/2022 1032   MCV 98.5 07/25/2022 1032   MCHC 34.3 07/25/2022 1032   RDW 14.4 07/25/2022 1032   BMET    Component Value Date/Time   NA 138 07/25/2022 1032   K 4.1 07/25/2022 1032   CL 105 07/25/2022 1032    CO2 26 07/25/2022 1032   GLUCOSE 102 (H) 07/25/2022 1032   BUN 8 07/25/2022 1032   CREATININE 0.86 07/25/2022 1032   CALCIUM 9.1 07/25/2022 1032        Assessment & Plan:   Discussion: 67 year old female active smoker with HTN, HLD, hx uterine fibroids who presents as for evaluation for left upper lobe lung nodule. Asymptomatic. Risk for malignancy assessed to be low-intermediate on Mayo calculator. We discussed risks and benefits of diagnostic testing vs surveillance imaging. Reviewed CT scan which demonstrates stable subsolid nodule. After shared decision making, will plan to pursue PET/CT scan.  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 Discussed risks and benefits --Reviewed CT scan. Stable subsolid nodule --ORDER PET/CT in 4 months (November 2024)  Health Maintenance Immunization History  Administered Date(s) Administered   Influenza,inj,Quad PF,6+ Mos 05/12/2017, 04/16/2020   PFIZER(Purple Top)SARS-COV-2 Vaccination 08/31/2019, 09/23/2019   Pneumococcal Conjugate-13 10/05/2021   Tdap 10/05/2021   CT Lung Screen - not qualified  Orders Placed This Encounter  Procedures   NM PET Image Initial (PI) Skull Base To Thigh    Standing Status:   Future    Standing Expiration Date:   12/28/2023    Scheduling Instructions:     Schedule in November 2024     Ensure patient has follow-up with me in clinic after CT scan    Order Specific Question:   If indicated for the ordered procedure, I authorize the administration of a radiopharmaceutical per Radiology protocol    Answer:   Yes    Order Specific Question:   Preferred imaging location?    Answer:   [facility]  No orders of the defined types were placed in this encounter.   Return in about 4 months (around 04/30/2023) for after CT scan.  I have spent a total time of 31-minutes on the day of the appointment including chart review, data review, collecting history, coordinating  care and discussing medical diagnosis and plan with the patient/family. Past medical history, allergies, medications were reviewed. Pertinent imaging, labs and tests included in this note have been reviewed and interpreted independently by me.  Raiyah Speakman Mechele Collin, MD [facility] 12/28/2022 1:29 PM  Office Number [phone]

## 2022-12-28 NOTE — Patient Instructions (Signed)
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 Discussed risks and benefits --Reviewed CT scan. Stable subsolid nodule --ORDER PET/CT in 4 months (November 2024)

## 2023-04-18 ENCOUNTER — Telehealth: Payer: Self-pay | Admitting: Pulmonary Disease

## 2023-04-18 NOTE — Telephone Encounter (Signed)
Pt called in b/c she wants to get her results from her CT Scan

## 2023-04-21 NOTE — Telephone Encounter (Signed)
Patient was calling to schedule an appt with JE so that she can get results of PET scheduled 04/24/23.Made aware booking into 2025 so let's wait until PET resulted and JE will determine how soon patient needed to be seen. Nothing further needed.

## 2023-04-24 ENCOUNTER — Ambulatory Visit (HOSPITAL_COMMUNITY)
Admission: RE | Admit: 2023-04-24 | Discharge: 2023-04-24 | Disposition: A | Discharge status: Home / Self Care | Payer: Medicare HMO | Source: Ambulatory Visit | Attending: Pulmonary Disease | Admitting: Pulmonary Disease

## 2023-04-24 DIAGNOSIS — R911 Solitary pulmonary nodule: Secondary | ICD-10-CM | POA: Diagnosis present

## 2023-04-24 LAB — GLUCOSE, CAPILLARY: Glucose-Capillary: 93 mg/dL (ref 70–99)

## 2023-04-24 MED ORDER — FLUDEOXYGLUCOSE F - 18 (FDG) INJECTION
12.0000 | Freq: Once | INTRAVENOUS | Status: AC | PRN
  Administered 2023-04-24: 13.29 via INTRAVENOUS

## 2023-05-11 ENCOUNTER — Telehealth (HOSPITAL_BASED_OUTPATIENT_CLINIC_OR_DEPARTMENT_OTHER): Payer: Self-pay | Admitting: Pulmonary Disease

## 2023-05-11 NOTE — Telephone Encounter (Signed)
[facility] Telephone Encounter  We discussed PET/CT results and shared the good news that her LUL nodule is stable and not hypermetabolic. Recommend PET/CT in 1 year. Patient expressed appreciation for call.   Please place recall for patient in 6 months (June/July 2025)

## 2023-05-26 ENCOUNTER — Other Ambulatory Visit: Payer: Self-pay

## 2023-05-26 ENCOUNTER — Encounter: Payer: Self-pay | Admitting: Emergency Medicine

## 2023-05-26 DIAGNOSIS — W268XXA Contact with other sharp object(s), not elsewhere classified, initial encounter: Secondary | ICD-10-CM | POA: Diagnosis not present

## 2023-05-26 DIAGNOSIS — Y93G1 Activity, food preparation and clean up: Secondary | ICD-10-CM | POA: Diagnosis not present

## 2023-05-26 DIAGNOSIS — Z79899 Other long term (current) drug therapy: Secondary | ICD-10-CM | POA: Insufficient documentation

## 2023-05-26 DIAGNOSIS — I10 Essential (primary) hypertension: Secondary | ICD-10-CM | POA: Insufficient documentation

## 2023-05-26 DIAGNOSIS — S6992XA Unspecified injury of left wrist, hand and finger(s), initial encounter: Secondary | ICD-10-CM | POA: Diagnosis present

## 2023-05-26 DIAGNOSIS — Z23 Encounter for immunization: Secondary | ICD-10-CM | POA: Insufficient documentation

## 2023-05-26 DIAGNOSIS — S61310A Laceration without foreign body of right index finger with damage to nail, initial encounter: Secondary | ICD-10-CM | POA: Insufficient documentation

## 2023-05-26 NOTE — ED Triage Notes (Signed)
Patient cut right second finger nail off with mandolin while cutting food tonight.  Patient has finger wrapped, bleeding controlled.  Bandage removed, most of nail gone, nail bed red, bleeding controlled.

## 2023-05-27 ENCOUNTER — Emergency Department
Admission: EM | Admit: 2023-05-27 | Discharge: 2023-05-27 | Disposition: A | Discharge status: Home / Self Care | Payer: Medicare HMO | Attending: Emergency Medicine | Admitting: Emergency Medicine

## 2023-05-27 DIAGNOSIS — S61310A Laceration without foreign body of right index finger with damage to nail, initial encounter: Secondary | ICD-10-CM

## 2023-05-27 MED ORDER — BACITRACIN ZINC 500 UNIT/GM EX OINT
TOPICAL_OINTMENT | Freq: Two times a day (BID) | CUTANEOUS | Status: DC
  Administered 2023-05-27: 1 via TOPICAL
  Filled 2023-05-27: qty 0.9

## 2023-05-27 MED ORDER — IBUPROFEN 800 MG PO TABS
800.0000 mg | ORAL_TABLET | Freq: Once | ORAL | Status: AC
  Administered 2023-05-27: 800 mg via ORAL
  Filled 2023-05-27: qty 1

## 2023-05-27 MED ORDER — TETANUS-DIPHTH-ACELL PERTUSSIS 5-2.5-18.5 LF-MCG/0.5 IM SUSY
0.5000 mL | PREFILLED_SYRINGE | Freq: Once | INTRAMUSCULAR | Status: AC
  Administered 2023-05-27: 0.5 mL via INTRAMUSCULAR
  Filled 2023-05-27: qty 0.5

## 2023-05-27 NOTE — Discharge Instructions (Signed)
Keep wound clean and dry.  Return to the ER for worsening symptoms, increased redness/swelling, purulent discharge or other concerns. 

## 2023-05-27 NOTE — ED Provider Notes (Signed)
[facility] Provider Note    Event Date/Time   First MD Initiated Contact with Patient 05/27/23 [phone]     (approximate)   History   Laceration   HPI  Felicia Barr is a 67 y.o. female who presents to the ED from home with a chief complaint of right finger laceration cut on a mandolin while cutting food tonight.  Patient is right-hand dominant.  Denies anticoagulant use.  Voices no other complaints or injuries.     Past Medical History   Past Medical History:  Diagnosis Date   Fibroids    Hyperlipidemia    Hypertension      Active Problem List   Patient Active Problem List   Diagnosis Date Noted   External hemorrhoids 10/24/2022   Hypokalemia 10/24/2022   Iron deficiency anemia 10/24/2022   Cigar smoker motivated to quit 10/24/2022   Incidental lung nodule, greater than or equal to 8mm 09/13/2022   History of iron deficiency anemia 07/25/2022   Vitamin D deficiency 07/25/2022   Mixed hyperlipidemia 07/25/2022   Prediabetes 07/25/2022   Primary hypertension 07/25/2022   Uterine leiomyoma 07/25/2022   Irregular heart rhythm 07/25/2022   Palpitations 07/25/2022   Morbid obesity with BMI of 45.0-49.9, adult (HCC) 04/17/2020   Hyperkalemia 06/28/2016     Past Surgical History   Past Surgical History:  Procedure Laterality Date   DILATION AND CURETTAGE OF UTERUS       Home Medications   Prior to Admission medications   Medication Sig Start Date End Date Taking? Authorizing Provider  amLODipine-valsartan (EXFORGE) 10-320 MG tablet Take 1 tablet by mouth daily. 08/02/22 07/28/23  Mort Sawyers, FNP  nicotine (NICODERM CQ - DOSED IN MG/24 HOURS) 21 mg/24hr patch Place 1 patch (21 mg total) onto the skin daily. 10/24/22   Mort Sawyers, FNP  rosuvastatin (CRESTOR) 20 MG tablet Take 1 tablet (20 mg total) by mouth daily. 08/02/22 07/28/23  Mort Sawyers, FNP     Allergies  Patient has no known allergies.   Family History   Family  History  Problem Relation Age of Onset   Aneurysm Mother    Heart disease Father    Heart attack Father        in his 20's, second MI age 99     Physical Exam  Triage Vital Signs: ED Triage Vitals  Encounter Vitals Group     BP 05/26/23 2244 (!) 174/72     Systolic BP Percentile --      Diastolic BP Percentile --      Pulse Rate 05/26/23 2244 78     Resp 05/26/23 2244 18     Temp 05/26/23 2244 98.2 F (36.8 C)     Temp Source 05/26/23 2244 Oral     SpO2 05/26/23 2244 99 %     Weight 05/26/23 2245 270 lb (122.5 kg)     Height --      Head Circumference --      Peak Flow --      Pain Score 05/26/23 2245 5     Pain Loc --      Pain Education --      Exclude from Growth Chart --     Updated Vital Signs: BP (!) 174/72   Pulse 78   Temp 98.2 F (36.8 C) (Oral)   Resp 18   Wt 122.5 kg   SpO2 99%   BMI 44.93 kg/m    General: Awake, no distress.  CV:  Good peripheral perfusion.  Resp:  Normal effort.  Abd:  No distention.  Other:  Right index finger: Mid nail shaved giving appearance of avulsion type laceration without active bleeding.  Nailbed intact.  Full range of motion right index finger.  Brisk, less than 5-second capillary refill.   ED Results / Procedures / Treatments  Labs (all labs ordered are listed, but only abnormal results are displayed) Labs Reviewed - No data to display   EKG  None   RADIOLOGY None   Official radiology report(s): No results found.   PROCEDURES:  Critical Care performed: No  Procedures   MEDICATIONS ORDERED IN ED: Medications  Tdap (BOOSTRIX) injection 0.5 mL (has no administration in time range)  ibuprofen (ADVIL) tablet 800 mg (has no administration in time range)  bacitracin ointment (has no administration in time range)     IMPRESSION / MDM / ASSESSMENT AND PLAN / ED COURSE  I reviewed the triage vital signs and the nursing notes.                             67 year old female presenting with right  index finger avulsion type laceration to nail without active bleeding.  Update tetanus, provide wound care and patient will follow-up with her PCP as needed.  Strict return precautions given.  Patient verbalizes understanding and agrees with plan of care.  Patient's presentation is most consistent with acute, uncomplicated illness.   FINAL CLINICAL IMPRESSION(S) / ED DIAGNOSES   Final diagnoses:  Laceration of right index finger without foreign body with damage to nail, initial encounter     Rx / DC Orders   ED Discharge Orders     None        Note:  This document was prepared using Dragon voice recognition software and may include unintentional dictation errors.   Irean Hong, MD 05/27/23 [phone]

## 2023-06-01 ENCOUNTER — Telehealth: Payer: Self-pay

## 2023-06-01 NOTE — Transitions of Care (Post Inpatient/ED Visit) (Signed)
Pt was seen 05/27/23 at [facility] ED ;pt was using mandalin and cut off rt index finger nail and nail bed; no sutures needed. pt was given tetanus shot but no other medications. pt said she dresses the finger each day with bacitracin and non stick bandage and wrap the finger. no drainage and has slight redness not infection. Pt scheduled appt with T Dugal FNP on 06/06/23 at 12 noon with UC & ED precautions and pt voiced understanding. Sending note to Hayden Pedro FNP.          06/01/2023  Name: Marca Lenser MRN: [id number] DOB: 02-13-1956  Today's TOC FU Call Status: Today's TOC FU Call Status:: Successful TOC FU Call Completed TOC FU Call Complete Date: 06/01/23 Patient's Name and Date of Birth confirmed.  Transition Care Management Follow-up Telephone Call Date of Discharge: 05/27/23 Discharge Facility: [facility] Type of Discharge: Emergency Department Reason for ED Visit: Other: (pt was using mandalin and cut off nail bed; no sutures needed. pt was given tetanus shot but no other medications. pt said she dresses each day with bacitracin and non sticvk bandage and wrap the finger. no drainage and has slight redness not infection.) How have you been since you were released from the hospital?: Better Any questions or concerns?: No  Items Reviewed: Did you receive and understand the discharge instructions provided?: Yes Medications obtained,verified, and reconciled?: Yes (Medications Reviewed) Any new allergies since your discharge?: No Dietary orders reviewed?: NA Do you have support at home?: Yes People in Home: significant other Name of Support/Comfort Primary Source: Urel  Medications Reviewed Today: Medications Reviewed Today     Reviewed by Patience Musca, LPN (Licensed Practical Nurse) on 06/01/23 at 1642  Med List Status: <None>   Medication Order Taking? Sig Documenting Provider Last Dose Status Informant  amLODipine-valsartan (EXFORGE) 10-320 MG  tablet [id number] No Take 1 tablet by mouth daily. Mort Sawyers, FNP Taking Active   nicotine (NICODERM CQ - DOSED IN MG/24 HOURS) 21 mg/24hr patch [id number] No Place 1 patch (21 mg total) onto the skin daily. Mort Sawyers, FNP Taking Active   rosuvastatin (CRESTOR) 20 MG tablet [id number] No Take 1 tablet (20 mg total) by mouth daily. Mort Sawyers, FNP Taking Active             Home Care and Equipment/Supplies: Were Home Health Services Ordered?: NA Any new equipment or medical supplies ordered?: NA  Functional Questionnaire: Do you need assistance with bathing/showering or dressing?: No Do you need assistance with meal preparation?: No Do you need assistance with eating?: No Do you have difficulty maintaining continence: No Do you need assistance with getting out of bed/getting out of a chair/moving?: No Do you have difficulty managing or taking your medications?: No  Follow up appointments reviewed: PCP Follow-up appointment confirmed?: Yes Date of PCP follow-up appointment?: 06/06/23 Follow-up Provider: Hayden Pedro FNP [facility] Follow-up appointment confirmed?: NA Do you need transportation to your follow-up appointment?: No Do you understand care options if your condition(s) worsen?: Yes-patient verbalized understanding    SIGNATURE Lewanda Rife, LPN

## 2023-06-02 NOTE — Telephone Encounter (Signed)
Noted  

## 2023-06-06 ENCOUNTER — Encounter: Payer: Self-pay | Admitting: Family

## 2023-06-06 ENCOUNTER — Ambulatory Visit: Payer: Medicare HMO | Admitting: Family

## 2023-06-06 VITALS — BP 134/76 | HR 78 | Temp 97.3°F | Ht 65.0 in | Wt 278.0 lb

## 2023-06-06 DIAGNOSIS — L03011 Cellulitis of right finger: Secondary | ICD-10-CM

## 2023-06-06 MED ORDER — DOXYCYCLINE HYCLATE 100 MG PO TABS: 100.0000 mg | ORAL_TABLET | Freq: Two times a day (BID) | ORAL | 0 refills | Status: AC

## 2023-06-06 NOTE — Patient Instructions (Signed)
Please monitor site for worsening signs/symptoms of infection to include: increasing redness, increasing tenderness, increase in size, and or pustulant drainage from site. If this is to occur please let me know immediately.  °

## 2023-06-06 NOTE — Progress Notes (Signed)
Established Patient Office Visit  Subjective:      CC:  Chief Complaint  Patient presents with   Hospitalization Follow-up    Cut finger     HPI: Felicia Barr is a 67 y.o. female presenting on 06/06/2023 for Hospitalization Follow-up (Cut finger ) . Went to [facility] ER 12/7 for laceration to right finger while using a mandolin at the time of cutting food.   Since discharge she has been washing it daily and soaking it twice daily and using bacitracin and wrapping with non stick bandage and gauze. She has noticed some yellow discharge at the nail bed. She does have some redness around the finger on the distal end with some tenderness on palpation.      Social history:  Relevant past medical, surgical, family and social history reviewed and updated as indicated. Interim medical history since our last visit reviewed.  Allergies and medications reviewed and updated.  DATA REVIEWED: CHART IN EPIC     ROS: Negative unless specifically indicated above in HPI.    Current Outpatient Medications:    amLODipine-valsartan (EXFORGE) 10-320 MG tablet, Take 1 tablet by mouth daily., Disp: 90 tablet, Rfl: 3   doxycycline (VIBRA-TABS) 100 MG tablet, Take 1 tablet (100 mg total) by mouth 2 (two) times daily for 10 days., Disp: 20 tablet, Rfl: 0   nicotine (NICODERM CQ - DOSED IN MG/24 HOURS) 21 mg/24hr patch, Place 1 patch (21 mg total) onto the skin daily., Disp: 28 patch, Rfl: 1   rosuvastatin (CRESTOR) 20 MG tablet, Take 1 tablet (20 mg total) by mouth daily., Disp: 90 tablet, Rfl: 3      Objective:    BP 134/76   Pulse 78   Temp (!) 97.3 F (36.3 C) (Temporal)   Ht 5\' 5"  (1.651 m)   Wt 278 lb (126.1 kg)   SpO2 98%   BMI 46.26 kg/m   Wt Readings from Last 3 Encounters:  06/06/23 278 lb (126.1 kg)  05/26/23 270 lb (122.5 kg)  12/28/22 277 lb 9.6 oz (125.9 kg)    Physical Exam Constitutional:      General: She is not in acute distress.    Appearance: Normal appearance.  She is normal weight. She is not ill-appearing, toxic-appearing or diaphoretic.  HENT:     Head: Normocephalic.  Cardiovascular:     Rate and Rhythm: Normal rate.  Pulmonary:     Effort: Pulmonary effort is normal.  Musculoskeletal:        General: Normal range of motion.  Skin:    Comments: Right second nail with central wound laceration with slight yellow discharge and surrounding erythema. Erythema and tenderness to nail bed and surrounding distal skin  Neurological:     General: No focal deficit present.     Mental Status: She is alert and oriented to person, place, and time. Mental status is at baseline.  Psychiatric:        Mood and Affect: Mood normal.        Behavior: Behavior normal.        Thought Content: Thought content normal.        Judgment: Judgment normal.            Assessment & Plan:  Cellulitis of right finger Assessment & Plan: RX doxycycline 100 mg po bid x 10 days Wound culture ordered pending results Pt advised to:  Please monitor site for worsening signs/symptoms of infection to include: increasing redness, increasing tenderness, increase in size, and  or pustulant drainage from site. If this is to occur please let me know immediately.    Orders: -     Doxycycline Hyclate; Take 1 tablet (100 mg total) by mouth 2 (two) times daily for 10 days.  Dispense: 20 tablet; Refill: 0 -     WOUND CULTURE     Return if symptoms worsen or fail to improve.  Mort Sawyers, MSN, APRN, FNP-C [facility]

## 2023-06-08 ENCOUNTER — Other Ambulatory Visit: Payer: Self-pay | Admitting: Family

## 2023-06-08 NOTE — Progress Notes (Signed)
noted 

## 2023-06-08 NOTE — Progress Notes (Unsigned)
difluca

## 2023-06-09 LAB — WOUND CULTURE
MICRO NUMBER:: 15860685
SPECIMEN QUALITY:: ADEQUATE

## 2023-06-12 ENCOUNTER — Encounter: Payer: Self-pay | Admitting: Family

## 2023-06-14 DIAGNOSIS — L03011 Cellulitis of right finger: Secondary | ICD-10-CM | POA: Insufficient documentation

## 2023-06-14 NOTE — Assessment & Plan Note (Signed)
RX doxycycline 100 mg po bid x 10 days Wound culture ordered pending results Pt advised to:  Please monitor site for worsening signs/symptoms of infection to include: increasing redness, increasing tenderness, increase in size, and or pustulant drainage from site. If this is to occur please let me know immediately.

## 2023-06-17 ENCOUNTER — Ambulatory Visit
Admission: EM | Admit: 2023-06-17 | Discharge: 2023-06-17 | Disposition: A | Discharge status: Home / Self Care | Payer: Medicare HMO | Attending: Emergency Medicine | Admitting: Emergency Medicine

## 2023-06-17 DIAGNOSIS — J069 Acute upper respiratory infection, unspecified: Secondary | ICD-10-CM | POA: Diagnosis not present

## 2023-06-17 LAB — POC COVID19/FLU A&B COMBO
Covid Antigen, POC: NEGATIVE
Influenza A Antigen, POC: NEGATIVE
Influenza B Antigen, POC: NEGATIVE

## 2023-06-17 MED ORDER — BENZONATATE 100 MG PO CAPS: 100.0000 mg | ORAL_CAPSULE | Freq: Three times a day (TID) | ORAL | 0 refills | Status: AC | PRN

## 2023-06-17 NOTE — ED Triage Notes (Signed)
Patient to Urgent Care with complaints of nasal congestion/ "hacking" cough/ body aches. Denies any fevers.  Reports symptoms started yesterday.

## 2023-06-17 NOTE — Discharge Instructions (Addendum)
 The COVID and flu tests are negative.   Take Tylenol or ibuprofen as needed for fever or discomfort.  Take plain Mucinex as needed for congestion.  Rest and keep yourself hydrated.    Follow-up with your primary care provider if your symptoms are not improving.

## 2023-06-17 NOTE — ED Provider Notes (Signed)
Renaldo Fiddler    CSN: [id number] Arrival date & time: 06/17/23  1339      History   Chief Complaint Chief Complaint  Patient presents with   URI    HPI Felicia Barr is a 67 y.o. female.  Patient presents with 1 day history of bodyaches, congestion, cough.  No OTC medications taken today; took NyQuil last night.  No fever, sore throat, shortness of breath, chest pain.  Her medical history includes hypertension.  The history is provided by the patient and medical records.    Past Medical History:  Diagnosis Date   Fibroids    Hyperlipidemia    Hypertension     Patient Active Problem List   Diagnosis Date Noted   External hemorrhoids 10/24/2022   Hypokalemia 10/24/2022   Iron deficiency anemia 10/24/2022   Cigar smoker motivated to quit 10/24/2022   Incidental lung nodule, greater than or equal to 8mm 09/13/2022   History of iron deficiency anemia 07/25/2022   Vitamin D deficiency 07/25/2022   Mixed hyperlipidemia 07/25/2022   Prediabetes 07/25/2022   Primary hypertension 07/25/2022   Uterine leiomyoma 07/25/2022   Irregular heart rhythm 07/25/2022   Palpitations 07/25/2022   Morbid obesity with BMI of 45.0-49.9, adult (HCC) 04/17/2020   Hyperkalemia 06/28/2016    Past Surgical History:  Procedure Laterality Date   DILATION AND CURETTAGE OF UTERUS      OB History     Gravida  2   Para  2   Term  2   Preterm      AB      Living  2      SAB      IAB      Ectopic      Multiple      Live Births  2            Home Medications    Prior to Admission medications   Medication Sig Start Date End Date Taking? Authorizing Provider  benzonatate (TESSALON) 100 MG capsule Take 1 capsule (100 mg total) by mouth 3 (three) times daily as needed for cough. 06/17/23  Yes Mickie Bail, NP  amLODipine-valsartan (EXFORGE) 10-320 MG tablet Take 1 tablet by mouth daily. 08/02/22 07/28/23  Mort Sawyers, FNP  nicotine (NICODERM CQ - DOSED IN MG/24  HOURS) 21 mg/24hr patch Place 1 patch (21 mg total) onto the skin daily. 10/24/22   Mort Sawyers, FNP  rosuvastatin (CRESTOR) 20 MG tablet Take 1 tablet (20 mg total) by mouth daily. 08/02/22 07/28/23  Mort Sawyers, FNP    Family History Family History  Problem Relation Age of Onset   Aneurysm Mother    Heart disease Father    Heart attack Father        in his 40's, second MI age 11    Social History Social History   Tobacco Use   Smoking status: Former    Current packs/day: 0.00    Average packs/day: 0.3 packs/day for 10.0 years (2.5 ttl pk-yrs)    Types: Cigarettes    Start date: 09/06/2012    Quit date: 09/07/2022    Years since quitting: 0.7   Smokeless tobacco: Former  Building services engineer status: Never Used  Substance Use Topics   Alcohol use: Never   Drug use: Never     Allergies   Patient has no known allergies.   Review of Systems Review of Systems  Constitutional:  Negative for chills and fever.  HENT:  Positive  for congestion. Negative for ear pain and sore throat.   Respiratory:  Positive for cough. Negative for shortness of breath.   Cardiovascular:  Negative for chest pain and palpitations.     Physical Exam Triage Vital Signs ED Triage Vitals  Encounter Vitals Group     BP 06/17/23 1506 138/75     Systolic BP Percentile --      Diastolic BP Percentile --      Pulse Rate 06/17/23 1506 83     Resp 06/17/23 1506 18     Temp 06/17/23 1506 98.8 F (37.1 C)     Temp src --      SpO2 06/17/23 1506 97 %     Weight --      Height --      Head Circumference --      Peak Flow --      Pain Score 06/17/23 1510 2     Pain Loc --      Pain Education --      Exclude from Growth Chart --    No data found.  Updated Vital Signs BP 138/75   Pulse 83   Temp 98.8 F (37.1 C)   Resp 18   SpO2 97%   Visual Acuity Right Eye Distance:   Left Eye Distance:   Bilateral Distance:    Right Eye Near:   Left Eye Near:    Bilateral Near:     Physical  Exam Constitutional:      General: She is not in acute distress. HENT:     Right Ear: Tympanic membrane normal.     Left Ear: Tympanic membrane normal.     Nose: Nose normal.     Mouth/Throat:     Mouth: Mucous membranes are moist.     Pharynx: Oropharynx is clear.     Comments: PND Cardiovascular:     Rate and Rhythm: Normal rate and regular rhythm.     Heart sounds: Normal heart sounds.  Pulmonary:     Effort: Pulmonary effort is normal. No respiratory distress.     Breath sounds: Normal breath sounds.  Neurological:     Mental Status: She is alert.      UC Treatments / Results  Labs (all labs ordered are listed, but only abnormal results are displayed) Labs Reviewed  POC COVID19/FLU A&B COMBO    EKG   Radiology No results found.  Procedures Procedures (including critical care time)  Medications Ordered in UC Medications - No data to display  Initial Impression / Assessment and Plan / UC Course  I have reviewed the triage vital signs and the nursing notes.  Pertinent labs & imaging results that were available during my care of the patient were reviewed by me and considered in my medical decision making (see chart for details).    Viral URI.  Rapid COVID and flu negative.  Treating cough with Tessalon Perles.  Discussed symptomatic treatment including Tylenol or ibuprofen as needed for fever or discomfort, plain Mucinex as needed for congestion, rest, hydration.  Instructed patient to follow-up with PCP if not improving.  ED precautions given.  Patient agrees to plan of care.   Final Clinical Impressions(s) / UC Diagnoses   Final diagnoses:  Viral URI     Discharge Instructions      The COVID and flu tests are negative.   Take Tylenol or ibuprofen as needed for fever or discomfort.  Take plain Mucinex as needed for congestion.  Rest and  keep yourself hydrated.    Follow-up with your primary care provider if your symptoms are not improving.          ED Prescriptions     Medication Sig Dispense Auth. Provider   benzonatate (TESSALON) 100 MG capsule Take 1 capsule (100 mg total) by mouth 3 (three) times daily as needed for cough. 21 capsule Mickie Bail, NP      PDMP not reviewed this encounter.   Mickie Bail, NP 06/17/23 [phone]

## 2023-07-22 ENCOUNTER — Other Ambulatory Visit: Payer: Self-pay | Admitting: Family

## 2023-08-09 ENCOUNTER — Ambulatory Visit (INDEPENDENT_AMBULATORY_CARE_PROVIDER_SITE_OTHER): Payer: Medicare HMO | Admitting: *Deleted

## 2023-08-09 DIAGNOSIS — Z0000 Encounter for general adult medical examination without abnormal findings: Secondary | ICD-10-CM | POA: Diagnosis not present

## 2023-08-09 NOTE — Patient Instructions (Signed)
 Ms. Felicia Barr , Thank you for taking time to come for your Medicare Wellness Visit. I appreciate your ongoing commitment to your health goals. Please review the following plan we discussed and let me know if I can assist you in the future.   Screening recommendations/referrals: Colonoscopy: up to date Mammogram: up to date Bone Density: up to date Recommended yearly ophthalmology/optometry visit for glaucoma screening and checkup Recommended yearly dental visit for hygiene and checkup  Vaccinations: Influenza vaccine: up to date Pneumococcal vaccine: Education provided Tdap vaccine: up to date      Preventive Care 65 Years and Older, Female Preventive care refers to lifestyle choices and visits with your health care provider that can promote health and wellness. What does preventive care include? A yearly physical exam. This is also called an annual well check. Dental exams once or twice a year. Routine eye exams. Ask your health care provider how often you should have your eyes checked. Personal lifestyle choices, including: Daily care of your teeth and gums. Regular physical activity. Eating a healthy diet. Avoiding tobacco and drug use. Limiting alcohol use. Practicing safe sex. Taking low-dose aspirin every day. Taking vitamin and mineral supplements as recommended by your health care provider. What happens during an annual well check? The services and screenings done by your health care provider during your annual well check will depend on your age, overall health, lifestyle risk factors, and family history of disease. Counseling  Your health care provider may ask you questions about your: Alcohol use. Tobacco use. Drug use. Emotional well-being. Home and relationship well-being. Sexual activity. Eating habits. History of falls. Memory and ability to understand (cognition). Work and work Astronomer. Reproductive health. Screening  You may have the following tests or  measurements: Height, weight, and BMI. Blood pressure. Lipid and cholesterol levels. These may be checked every 5 years, or more frequently if you are over 14 years old. Skin check. Lung cancer screening. You may have this screening every year starting at age 55 if you have a 30-pack-year history of smoking and currently smoke or have quit within the past 15 years. Fecal occult blood test (FOBT) of the stool. You may have this test every year starting at age 95. Flexible sigmoidoscopy or colonoscopy. You may have a sigmoidoscopy every 5 years or a colonoscopy every 10 years starting at age 31. Hepatitis C blood test. Hepatitis B blood test. Sexually transmitted disease (STD) testing. Diabetes screening. This is done by checking your blood sugar (glucose) after you have not eaten for a while (fasting). You may have this done every 1-3 years. Bone density scan. This is done to screen for osteoporosis. You may have this done starting at age 2. Mammogram. This may be done every 1-2 years. Talk to your health care provider about how often you should have regular mammograms. Talk with your health care provider about your test results, treatment options, and if necessary, the need for more tests. Vaccines  Your health care provider may recommend certain vaccines, such as: Influenza vaccine. This is recommended every year. Tetanus, diphtheria, and acellular pertussis (Tdap, Td) vaccine. You may need a Td booster every 10 years. Zoster vaccine. You may need this after age 24. Pneumococcal 13-valent conjugate (PCV13) vaccine. One dose is recommended after age 59. Pneumococcal polysaccharide (PPSV23) vaccine. One dose is recommended after age 19. Talk to your health care provider about which screenings and vaccines you need and how often you need them. This information is not intended to replace  advice given to you by your health care provider. Make sure you discuss any questions you have with your  health care provider. Document Released: 07/03/2015 Document Revised: 02/24/2016 Document Reviewed: 04/07/2015 Elsevier Interactive Patient Education  2017 [employer].  Fall Prevention in the Home Falls can cause injuries. They can happen to people of all ages. There are many things you can do to make your home safe and to help prevent falls. What can I do on the outside of my home? Regularly fix the edges of walkways and driveways and fix any cracks. Remove anything that might make you trip as you walk through a door, such as a raised step or threshold. Trim any bushes or trees on the path to your home. Use bright outdoor lighting. Clear any walking paths of anything that might make someone trip, such as rocks or tools. Regularly check to see if handrails are loose or broken. Make sure that both sides of any steps have handrails. Any raised decks and porches should have guardrails on the edges. Have any leaves, snow, or ice cleared regularly. Use sand or salt on walking paths during winter. Clean up any spills in your garage right away. This includes oil or grease spills. What can I do in the bathroom? Use night lights. Install grab bars by the toilet and in the tub and shower. Do not use towel bars as grab bars. Use non-skid mats or decals in the tub or shower. If you need to sit down in the shower, use a plastic, non-slip stool. Keep the floor dry. Clean up any water that spills on the floor as soon as it happens. Remove soap buildup in the tub or shower regularly. Attach bath mats securely with double-sided non-slip rug tape. Do not have throw rugs and other things on the floor that can make you trip. What can I do in the bedroom? Use night lights. Make sure that you have a light by your bed that is easy to reach. Do not use any sheets or blankets that are too big for your bed. They should not hang down onto the floor. Have a firm chair that has side arms. You can use this for  support while you get dressed. Do not have throw rugs and other things on the floor that can make you trip. What can I do in the kitchen? Clean up any spills right away. Avoid walking on wet floors. Keep items that you use a lot in easy-to-reach places. If you need to reach something above you, use a strong step stool that has a grab bar. Keep electrical cords out of the way. Do not use floor polish or wax that makes floors slippery. If you must use wax, use non-skid floor wax. Do not have throw rugs and other things on the floor that can make you trip. What can I do with my stairs? Do not leave any items on the stairs. Make sure that there are handrails on both sides of the stairs and use them. Fix handrails that are broken or loose. Make sure that handrails are as long as the stairways. Check any carpeting to make sure that it is firmly attached to the stairs. Fix any carpet that is loose or worn. Avoid having throw rugs at the top or bottom of the stairs. If you do have throw rugs, attach them to the floor with carpet tape. Make sure that you have a light switch at the top of the stairs and the bottom  of the stairs. If you do not have them, ask someone to add them for you. What else can I do to help prevent falls? Wear shoes that: Do not have high heels. Have rubber bottoms. Are comfortable and fit you well. Are closed at the toe. Do not wear sandals. If you use a stepladder: Make sure that it is fully opened. Do not climb a closed stepladder. Make sure that both sides of the stepladder are locked into place. Ask someone to hold it for you, if possible. Clearly mark and make sure that you can see: Any grab bars or handrails. First and last steps. Where the edge of each step is. Use tools that help you move around (mobility aids) if they are needed. These include: Canes. Walkers. Scooters. Crutches. Turn on the lights when you go into a dark area. Replace any light bulbs as soon  as they burn out. Set up your furniture so you have a clear path. Avoid moving your furniture around. If any of your floors are uneven, fix them. If there are any pets around you, be aware of where they are. Review your medicines with your doctor. Some medicines can make you feel dizzy. This can increase your chance of falling. Ask your doctor what other things that you can do to help prevent falls. This information is not intended to replace advice given to you by your health care provider. Make sure you discuss any questions you have with your health care provider. Document Released: 04/02/2009 Document Revised: 11/12/2015 Document Reviewed: 07/11/2014 Elsevier Interactive Patient Education  2017 [employer].

## 2023-08-09 NOTE — Progress Notes (Signed)
 Subjective:   Felicia Barr is a 68 y.o. female who presents for Medicare Annual (Subsequent) preventive examination.  Visit Complete: Virtual I connected with  Felicia Barr on 08/09/23 by a audio enabled telemedicine application and verified that I am speaking with the correct person using two identifiers.  Patient Location: Home  Provider Location: Home Office  I discussed the limitations of evaluation and management by telemedicine. The patient expressed understanding and agreed to proceed.  Vital Signs: Because this visit was a virtual/telehealth visit, some criteria may be missing or patient reported. Any vitals not documented were not able to be obtained and vitals that have been documented are patient reported.  Patient Medicare AWV questionnaire was completed by the patient on ; I have confirmed that all information answered by patient is correct and no changes since this date.  Cardiac Risk Factors include: advanced age (>38men, >67 women)     Objective:    There were no vitals filed for this visit. There is no height or weight on file to calculate BMI.     08/09/2023   12:52 PM 06/17/2023    3:10 PM 05/26/2023   10:45 PM 02/08/2020    5:26 PM  Advanced Directives  Does Patient Have a Medical Advance Directive? Yes No No No  Type of Advance Directive Living will     Would patient like information on creating a medical advance directive?    No - Patient declined    Current Medications (verified) Outpatient Encounter Medications as of 08/09/2023  Medication Sig   amLODipine-valsartan (EXFORGE) 10-320 MG tablet TAKE 1 TABLET BY MOUTH EVERY DAY   benzonatate (TESSALON) 100 MG capsule Take 1 capsule (100 mg total) by mouth 3 (three) times daily as needed for cough.   nicotine (NICODERM CQ - DOSED IN MG/24 HOURS) 21 mg/24hr patch Place 1 patch (21 mg total) onto the skin daily.   rosuvastatin (CRESTOR) 20 MG tablet TAKE 1 TABLET BY MOUTH EVERY DAY   No  facility-administered encounter medications on file as of 08/09/2023.    Allergies (verified) Patient has no known allergies.   History: Past Medical History:  Diagnosis Date   Fibroids    Hyperlipidemia    Hypertension    Past Surgical History:  Procedure Laterality Date   DILATION AND CURETTAGE OF UTERUS     Family History  Problem Relation Age of Onset   Aneurysm Mother    Heart disease Father    Heart attack Father        in his 57's, second MI age 75   Social History   Socioeconomic History   Marital status: Significant Other    Spouse name: Not on file   Number of children: 2   Years of education: Not on file   Highest education level: Master's degree (e.g., MA, MS, MEng, MEd, MSW, MBA)  Occupational History   Occupation: retired  Tobacco Use   Smoking status: Former    Current packs/day: 0.00    Average packs/day: 0.3 packs/day for 10.0 years (2.5 ttl pk-yrs)    Types: Cigarettes    Start date: 09/06/2012    Quit date: 09/07/2022    Years since quitting: 0.9   Smokeless tobacco: Former  Building services engineer status: Never Used  Substance and Sexual Activity   Alcohol use: Never   Drug use: Never   Sexual activity: Yes    Partners: Male    Birth control/protection: Post-menopausal  Other Topics Concern   Not  on file  Social History Narrative   Not on file   Social Drivers of Health   Financial Resource Strain: Low Risk  (08/09/2023)   Overall Financial Resource Strain (CARDIA)    Difficulty of Paying Living Expenses: Not hard at all  Food Insecurity: No Food Insecurity (08/09/2023)   Hunger Vital Sign    Worried About Running Out of Food in the Last Year: Never true    Ran Out of Food in the Last Year: Never true  Transportation Needs: No Transportation Needs (08/09/2023)   PRAPARE - Administrator, Civil Service (Medical): No    Lack of Transportation (Non-Medical): No  Physical Activity: Insufficiently Active (08/09/2023)   Exercise  Vital Sign    Days of Exercise per Week: 1 day    Minutes of Exercise per Session: 10 min  Stress: No Stress Concern Present (08/09/2023)   [employer] of Occupational Health - Occupational Stress Questionnaire    Feeling of Stress : Not at all  Social Connections: Moderately Integrated (08/09/2023)   Social Connection and Isolation Panel [NHANES]    Frequency of Communication with Friends and Family: More than three times a week    Frequency of Social Gatherings with Friends and Family: More than three times a week    Attends Religious Services: More than 4 times per year    Active Member of [employer] or Organizations: Yes    Attends Banker Meetings: 1 to 4 times per year    Marital Status: Never married    Tobacco Counseling Counseling given: Not Answered   Clinical Intake:  Pre-visit preparation completed: Yes  Pain : No/denies pain     Diabetes: No  How often do you need to have someone help you when you read instructions, pamphlets, or other written materials from your doctor or pharmacy?: 1 - Never  Interpreter Needed?: No  Information entered by :: Remi Haggard LPN   Activities of Daily Living    08/09/2023   12:55 PM 08/07/2023   12:25 PM  In your present state of health, do you have any difficulty performing the following activities:  Hearing? 0 0  Vision? 0 0  Difficulty concentrating or making decisions? 0 0  Walking or climbing stairs? 0 0  Dressing or bathing? 0 0  Doing errands, shopping? 0 0  Preparing Food and eating ? N N  Using the Toilet? N N  In the past six months, have you accidently leaked urine? N N  Do you have problems with loss of bowel control? N N  Managing your Medications? N N  Managing your Finances? N N  Housekeeping or managing your Housekeeping? N N    Patient Care Team: Mort Sawyers, FNP as PCP - General (Family Medicine) Corky Crafts, MD as PCP - Cardiology (Cardiology)  Indicate any recent Medical  Services you may have received from other than [facility] providers in the past year (date may be approximate).     Assessment:   This is a routine wellness examination for Felicia Barr.  Hearing/Vision screen Hearing Screening - Comments:: No trouble hearing   Vision Screening - Comments:: Up to date Eye lab  go every  two year   Goals Addressed             This Visit's Progress    Weight (lb) < 200 lb (90.7 kg)         Depression Screen    08/09/2023   12:55 PM 06/06/2023  12:11 PM 10/24/2022   10:40 AM 07/25/2022   12:59 PM  PHQ 2/9 Scores  PHQ - 2 Score 0 0 0 0  PHQ- 9 Score 0 0  0    Fall Risk    08/07/2023   12:25 PM 06/06/2023   12:11 PM 10/24/2022   10:40 AM 07/25/2022   12:59 PM  Fall Risk   Falls in the past year? 0 0 0 0  Number falls in past yr: 0 0 0 0  Injury with Fall? 0 0 0 0  Risk for fall due to :  No Fall Risks    Follow up  Falls evaluation completed Falls evaluation completed;Education provided;Falls prevention discussed Falls evaluation completed;Education provided    MEDICARE RISK AT HOME: Medicare Risk at Home Any stairs in or around the home?: Yes If so, are there any without handrails?: No Home free of loose throw rugs in walkways, pet beds, electrical cords, etc?: Yes Adequate lighting in your home to reduce risk of falls?: Yes Life alert?: No Use of a cane, walker or w/c?: No Grab bars in the bathroom?: No Shower chair or bench in shower?: No Elevated toilet seat or a handicapped toilet?: No  TIMED UP AND GO:  Was the test performed?  No    Cognitive Function:        08/09/2023   12:54 PM  6CIT Screen  What Year? 0 points  What month? 0 points  What time? 0 points  Count back from 20 0 points  Months in reverse 0 points  Repeat phrase 0 points  Total Score 0 points    Immunizations Immunization History  Administered Date(s) Administered   Influenza,inj,Quad PF,6+ Mos 05/12/2017, 04/16/2020   Influenza-Unspecified 04/04/2023    PFIZER(Purple Top)SARS-COV-2 Vaccination 08/31/2019, 09/23/2019   Pneumococcal Conjugate-13 10/05/2021   Tdap 10/05/2021, 05/27/2023    TDAP status: Due, Education has been provided regarding the importance of this vaccine. Advised may receive this vaccine at local pharmacy or Health Dept. Aware to provide a copy of the vaccination record if obtained from local pharmacy or Health Dept. Verbalized acceptance and understanding.  Flu Vaccine status: Up to date  Pneumococcal vaccine status: Due, Education has been provided regarding the importance of this vaccine. Advised may receive this vaccine at local pharmacy or Health Dept. Aware to provide a copy of the vaccination record if obtained from local pharmacy or Health Dept. Verbalized acceptance and understanding.  Covid-19 vaccine status: Information provided on how to obtain vaccines.   Qualifies for Shingles Vaccine? Yes   Zostavax completed No   Shingrix Completed?: No.    Education has been provided regarding the importance of this vaccine. Patient has been advised to call insurance company to determine out of pocket expense if they have not yet received this vaccine. Advised may also receive vaccine at local pharmacy or Health Dept. Verbalized acceptance and understanding.  Screening Tests Health Maintenance  Topic Date Due   Hepatitis C Screening  Never done   Zoster Vaccines- Shingrix (1 of 2) Never done   COVID-19 Vaccine (3 - Pfizer risk series) 10/21/2019   Pneumonia Vaccine 78+ Years old (2 of 2 - PPSV23 or PCV20) 10/06/2022   Medicare Annual Wellness (AWV)  08/08/2024   MAMMOGRAM  09/26/2024   Colonoscopy  09/16/2030   INFLUENZA VACCINE  Completed   DEXA SCAN  Completed   HPV VACCINES  Aged Out   DTaP/Tdap/Td  Discontinued    Health Maintenance  Health Maintenance Due  Topic Date Due   Hepatitis C Screening  Never done   Zoster Vaccines- Shingrix (1 of 2) Never done   COVID-19 Vaccine (3 - Pfizer risk series)  10/21/2019   Pneumonia Vaccine 50+ Years old (2 of 2 - PPSV23 or PCV20) 10/06/2022    Colorectal cancer screening: Type of screening: Colonoscopy. Completed 2022. Repeat every 10 years  Mammogram status: Completed  . Repeat every year  Bone Density status: Completed 2024. Results reflect: Bone density results: NORMAL. Repeat every   years.  Lung Cancer Screening: (Low Dose CT Chest recommended if Age 51-80 years, 20 pack-year currently smoking OR have quit w/in 15years.) does not qualify.   Lung Cancer Screening Referral:   Additional Screening:  Hepatitis C Screening: does not qualify; Completed never done  Vision Screening: Recommended annual ophthalmology exams for early detection of glaucoma and other disorders of the eye. Is the patient up to date with their annual eye exam?  Yes  Who is the provider or what is the name of the office in which the patient attends annual eye exams? [facility] If pt is not established with a provider, would they like to be referred to a provider to establish care? No .   Dental Screening: Recommended annual dental exams for proper oral hygiene   Community Resource Referral / Chronic Care Management: CRR required this visit?  No   CCM required this visit?  No     Plan:     I have personally reviewed and noted the following in the patient's chart:   Medical and social history Use of alcohol, tobacco or illicit drugs  Current medications and supplements including opioid prescriptions. Patient is not currently taking opioid prescriptions. Functional ability and status Nutritional status Physical activity Advanced directives List of other physicians Hospitalizations, surgeries, and ER visits in previous 12 months Vitals Screenings to include cognitive, depression, and falls Referrals and appointments  In addition, I have reviewed and discussed with patient certain preventive protocols, quality metrics, and best practice recommendations. A  written personalized care plan for preventive services as well as general preventive health recommendations were provided to patient.     Remi Haggard, LPN   6/96/2952   After Visit Summary: (MyChart) Due to this being a telephonic visit, the after visit summary with patients personalized plan was offered to patient via MyChart   Nurse Notes:

## 2023-08-24 ENCOUNTER — Other Ambulatory Visit: Payer: Self-pay | Admitting: Family

## 2023-08-24 DIAGNOSIS — Z1231 Encounter for screening mammogram for malignant neoplasm of breast: Secondary | ICD-10-CM

## 2023-09-29 ENCOUNTER — Ambulatory Visit

## 2023-09-29 ENCOUNTER — Ambulatory Visit
Admission: RE | Admit: 2023-09-29 | Discharge: 2023-09-29 | Disposition: A | Discharge status: Home / Self Care | Source: Ambulatory Visit | Attending: Family | Admitting: Family

## 2023-09-29 DIAGNOSIS — Z1231 Encounter for screening mammogram for malignant neoplasm of breast: Secondary | ICD-10-CM

## 2023-10-05 ENCOUNTER — Encounter: Payer: Self-pay | Admitting: Family

## 2023-10-23 ENCOUNTER — Ambulatory Visit
Admission: EM | Admit: 2023-10-23 | Discharge: 2023-10-23 | Disposition: A | Discharge status: Home / Self Care | Attending: Emergency Medicine | Admitting: Emergency Medicine

## 2023-10-23 DIAGNOSIS — M79642 Pain in left hand: Secondary | ICD-10-CM | POA: Diagnosis not present

## 2023-10-23 MED ORDER — PREDNISONE 20 MG PO TABS: 40.0000 mg | ORAL_TABLET | Freq: Every day | ORAL | 0 refills | Status: AC

## 2023-10-23 MED ORDER — KETOROLAC TROMETHAMINE 30 MG/ML IJ SOLN
30.0000 mg | Freq: Once | INTRAMUSCULAR | Status: AC
Start: 2023-10-23 — End: 2023-10-23
  Administered 2023-10-23: 30 mg via INTRAMUSCULAR

## 2023-10-23 NOTE — ED Triage Notes (Signed)
 Patient presents to UC for left wrist pain x 2 days. States it flared up from gardening. Treating with ibuprofen  600 mg. Last dose last night.

## 2023-10-23 NOTE — ED Provider Notes (Signed)
 Arlander Bellman    CSN: [id number] Arrival date & time: 10/23/23  2130      History   Chief Complaint Chief Complaint  Patient presents with   Wrist Pain    HPI Letosha Treml is a 68 y.o. female.   Presents for evaluation of left hand pain beginning at the base of the left thumb and extending into the palm with swelling present for 2 days.   started after gardening daily over the past week.  Associated numbness.  Having difficulty bending the fingers but able to fully extend.  Has been taking ibuprofen  600 mg, using Ace bandage compression and ice which has been somewhat helpful but symptoms have persisted.  Denies injury.  Past Medical History:  Diagnosis Date   Fibroids    Hyperlipidemia    Hypertension     Patient Active Problem List   Diagnosis Date Noted   External hemorrhoids 10/24/2022   Hypokalemia 10/24/2022   Iron deficiency anemia 10/24/2022   Cigar smoker motivated to quit 10/24/2022   Incidental lung nodule, greater than or equal to 8mm 09/13/2022   History of iron deficiency anemia 07/25/2022   Vitamin D  deficiency 07/25/2022   Mixed hyperlipidemia 07/25/2022   Prediabetes 07/25/2022   Primary hypertension 07/25/2022   Uterine leiomyoma 07/25/2022   Irregular heart rhythm 07/25/2022   Palpitations 07/25/2022   Morbid obesity with BMI of 45.0-49.9, adult (HCC) 04/17/2020   Hyperkalemia 06/28/2016    Past Surgical History:  Procedure Laterality Date   DILATION AND CURETTAGE OF UTERUS      OB History     Gravida  2   Para  2   Term  2   Preterm      AB      Living  2      SAB      IAB      Ectopic      Multiple      Live Births  2            Home Medications    Prior to Admission medications   Medication Sig Start Date End Date Taking? Authorizing Provider  predniSONE (DELTASONE) 20 MG tablet Take 2 tablets (40 mg total) by mouth daily. 10/23/23  Yes Sidney Kann R, NP  amLODipine -valsartan  (EXFORGE ) 10-320 MG  tablet TAKE 1 TABLET BY MOUTH EVERY DAY 07/23/23   Felicita Horns, FNP  benzonatate  (TESSALON ) 100 MG capsule Take 1 capsule (100 mg total) by mouth 3 (three) times daily as needed for cough. 06/17/23   Wellington Half, NP  nicotine  (NICODERM CQ  - DOSED IN MG/24 HOURS) 21 mg/24hr patch Place 1 patch (21 mg total) onto the skin daily. 10/24/22   Felicita Horns, FNP  rosuvastatin  (CRESTOR ) 20 MG tablet TAKE 1 TABLET BY MOUTH EVERY DAY 07/23/23   Felicita Horns, FNP    Family History Family History  Problem Relation Age of Onset   Aneurysm Mother    Heart disease Father    Heart attack Father        in his 57's, second MI age 1    Social History Social History   Tobacco Use   Smoking status: Former    Current packs/day: 0.00    Average packs/day: 0.3 packs/day for 10.0 years (2.5 ttl pk-yrs)    Types: Cigarettes    Start date: 09/06/2012    Quit date: 09/07/2022    Years since quitting: 1.1   Smokeless tobacco: Former  Building services engineer  status: Never Used  Substance Use Topics   Alcohol use: Never   Drug use: Never     Allergies   Patient has no known allergies.   Review of Systems Review of Systems   Physical Exam Triage Vital Signs ED Triage Vitals  Encounter Vitals Group     BP --      Systolic BP Percentile --      Diastolic BP Percentile --      Pulse Rate 10/23/23 0936 75     Resp 10/23/23 0936 16     Temp 10/23/23 0936 97.8 F (36.6 C)     Temp Source 10/23/23 0936 Oral     SpO2 10/23/23 0936 98 %     Weight --      Height --      Head Circumference --      Peak Flow --      Pain Score 10/23/23 1006 6     Pain Loc --      Pain Education --      Exclude from Growth Chart --    No data found.  Updated Vital Signs Pulse 75   Temp 97.8 F (36.6 C) (Oral)   Resp 16   SpO2 98%   Visual Acuity Right Eye Distance:   Left Eye Distance:   Bilateral Distance:    Right Eye Near:   Left Eye Near:    Bilateral Near:     Physical Exam Constitutional:       Appearance: Normal appearance.  Eyes:     Extraocular Movements: Extraocular movements intact.  Pulmonary:     Effort: Pulmonary effort is normal.  Musculoskeletal:     Comments: Generalized swelling to the left thumb, tenderness present at the base of the first metacarpal, sensation intact, capillary refill less than 3, 2+ radial pulse, able to complete range of motion but pain is elicited with flexion  Neurological:     Mental Status: She is alert and oriented to person, place, and time. Mental status is at baseline.      UC Treatments / Results  Labs (all labs ordered are listed, but only abnormal results are displayed) Labs Reviewed - No data to display  EKG   Radiology No results found.  Procedures Procedures (including critical care time)  Medications Ordered in UC Medications  ketorolac (TORADOL) 30 MG/ML injection 30 mg (30 mg Intramuscular Given 10/23/23 1006)    Initial Impression / Assessment and Plan / UC Course  I have reviewed the triage vital signs and the nursing notes.  Pertinent labs & imaging results that were available during my care of the patient were reviewed by me and considered in my medical decision making (see chart for details).  Left Hand pain  Etiology most likely inflammatory, denies injury therefore imaging deferred, discussed with patient, Toradol IM given and prescribed prednisone for home use, recommended continue supportive care with RICE, heat massage stretching with activity as tolerated, walking referral given orthopedics if no improvement seen Final Clinical Impressions(s) / UC Diagnoses   Final diagnoses:  Left hand pain     Discharge Instructions      Your pain is most likely caused by irritation to the joint most likely caused by arthritis  You have been given an injection of Toradol to help reduce inflammation and help with pain and ideally will see improvement within the next hour  Starting tomorrow take prednisone  every morning with food for 5 days as directed to continue  the above process, may use Tylenol or any topical medicines during treatment  Continue use of Ace bandage for compression  You may use heating pad in 15 minute intervals as needed for additional comfort, or you may find comfort in using ice in 10-15 minutes over affected area  Begin massaging and stretching affected area daily for 10 minutes as tolerated to further loosen muscles   If pain persist after recommended treatment or reoccurs if may be beneficial to follow up with orthopedic specialist for evaluation, this doctor specializes in the bones and can manage your symptoms long-term with options such as but not limited to imaging, medications or physical therapy      ED Prescriptions     Medication Sig Dispense Auth. Provider   predniSONE (DELTASONE) 20 MG tablet Take 2 tablets (40 mg total) by mouth daily. 10 tablet Caralee Morea R, NP      PDMP not reviewed this encounter.   Reena Canning, NP 10/23/23 1026

## 2023-10-23 NOTE — Discharge Instructions (Signed)
 Your pain is most likely caused by irritation to the joint most likely caused by arthritis  You have been given an injection of Toradol to help reduce inflammation and help with pain and ideally will see improvement within the next hour  Starting tomorrow take prednisone every morning with food for 5 days as directed to continue the above process, may use Tylenol or any topical medicines during treatment  Continue use of Ace bandage for compression  You may use heating pad in 15 minute intervals as needed for additional comfort, or you may find comfort in using ice in 10-15 minutes over affected area  Begin massaging and stretching affected area daily for 10 minutes as tolerated to further loosen muscles   If pain persist after recommended treatment or reoccurs if may be beneficial to follow up with orthopedic specialist for evaluation, this doctor specializes in the bones and can manage your symptoms long-term with options such as but not limited to imaging, medications or physical therapy

## 2024-07-16 ENCOUNTER — Other Ambulatory Visit: Payer: Self-pay | Admitting: Family

## 2024-09-05 ENCOUNTER — Ambulatory Visit
